# Patient Record
Sex: Male | Born: 1975 | Race: Black or African American | Hispanic: No | Marital: Married | Smoking: Never smoker
Health system: Southern US, Community
[De-identification: ages and names within clinical notes are randomized; demographics above are authoritative.]

## PROBLEM LIST (undated history)

## (undated) DIAGNOSIS — I10 Essential (primary) hypertension: Secondary | ICD-10-CM

---

## 2014-05-09 ENCOUNTER — Ambulatory Visit (INDEPENDENT_AMBULATORY_CARE_PROVIDER_SITE_OTHER): Payer: BLUE CROSS/BLUE SHIELD

## 2014-05-09 ENCOUNTER — Ambulatory Visit (INDEPENDENT_AMBULATORY_CARE_PROVIDER_SITE_OTHER): Payer: BLUE CROSS/BLUE SHIELD | Admitting: Family Medicine

## 2014-05-09 ENCOUNTER — Encounter (HOSPITAL_COMMUNITY): Payer: Self-pay | Admitting: Emergency Medicine

## 2014-05-09 ENCOUNTER — Inpatient Hospital Stay (HOSPITAL_COMMUNITY): Payer: Non-veteran care

## 2014-05-09 ENCOUNTER — Encounter (HOSPITAL_COMMUNITY)
Admission: EM | Disposition: A | Discharge status: Home / Self Care | Payer: Non-veteran care | Source: Home / Self Care | Attending: Neurosurgery

## 2014-05-09 ENCOUNTER — Inpatient Hospital Stay (HOSPITAL_COMMUNITY): Payer: Non-veteran care | Admitting: Certified Registered Nurse Anesthetist

## 2014-05-09 ENCOUNTER — Inpatient Hospital Stay (HOSPITAL_COMMUNITY)
Admission: EM | Admit: 2014-05-09 | Discharge: 2014-05-11 | DRG: 472 | Disposition: A | Discharge status: Home / Self Care | Payer: Non-veteran care | Attending: Neurosurgery | Admitting: Neurosurgery

## 2014-05-09 VITALS — BP 120/86 | HR 77 | Temp 98.4°F | Resp 16 | Ht 68.75 in | Wt 177.0 lb

## 2014-05-09 DIAGNOSIS — I10 Essential (primary) hypertension: Secondary | ICD-10-CM | POA: Diagnosis present

## 2014-05-09 DIAGNOSIS — M8458XA Pathological fracture in neoplastic disease, other specified site, initial encounter for fracture: Secondary | ICD-10-CM | POA: Diagnosis present

## 2014-05-09 DIAGNOSIS — Z791 Long term (current) use of non-steroidal anti-inflammatories (NSAID): Secondary | ICD-10-CM

## 2014-05-09 DIAGNOSIS — D492 Neoplasm of unspecified behavior of bone, soft tissue, and skin: Secondary | ICD-10-CM | POA: Diagnosis present

## 2014-05-09 DIAGNOSIS — M4852XG Collapsed vertebra, not elsewhere classified, cervical region, subsequent encounter for fracture with delayed healing: Secondary | ICD-10-CM | POA: Diagnosis not present

## 2014-05-09 DIAGNOSIS — M791 Myalgia: Secondary | ICD-10-CM | POA: Diagnosis present

## 2014-05-09 DIAGNOSIS — Z808 Family history of malignant neoplasm of other organs or systems: Secondary | ICD-10-CM | POA: Diagnosis not present

## 2014-05-09 DIAGNOSIS — S12490B Other displaced fracture of fifth cervical vertebra, initial encounter for open fracture: Secondary | ICD-10-CM | POA: Insufficient documentation

## 2014-05-09 DIAGNOSIS — M4120 Other idiopathic scoliosis, site unspecified: Secondary | ICD-10-CM | POA: Diagnosis not present

## 2014-05-09 DIAGNOSIS — R740 Nonspecific elevation of levels of transaminase and lactic acid dehydrogenase [LDH]: Secondary | ICD-10-CM | POA: Diagnosis present

## 2014-05-09 DIAGNOSIS — M4852XA Collapsed vertebra, not elsewhere classified, cervical region, initial encounter for fracture: Secondary | ICD-10-CM

## 2014-05-09 DIAGNOSIS — D751 Secondary polycythemia: Secondary | ICD-10-CM | POA: Diagnosis not present

## 2014-05-09 DIAGNOSIS — Z8249 Family history of ischemic heart disease and other diseases of the circulatory system: Secondary | ICD-10-CM | POA: Diagnosis not present

## 2014-05-09 DIAGNOSIS — R011 Cardiac murmur, unspecified: Secondary | ICD-10-CM | POA: Diagnosis present

## 2014-05-09 DIAGNOSIS — M8448XA Pathological fracture, other site, initial encounter for fracture: Secondary | ICD-10-CM | POA: Diagnosis present

## 2014-05-09 DIAGNOSIS — M8448XG Pathological fracture, other site, subsequent encounter for fracture with delayed healing: Secondary | ICD-10-CM

## 2014-05-09 DIAGNOSIS — G9529 Other cord compression: Secondary | ICD-10-CM | POA: Diagnosis present

## 2014-05-09 DIAGNOSIS — R918 Other nonspecific abnormal finding of lung field: Secondary | ICD-10-CM | POA: Diagnosis present

## 2014-05-09 DIAGNOSIS — S129XXD Fracture of neck, unspecified, subsequent encounter: Secondary | ICD-10-CM | POA: Diagnosis not present

## 2014-05-09 DIAGNOSIS — R200 Anesthesia of skin: Secondary | ICD-10-CM

## 2014-05-09 DIAGNOSIS — M4850XA Collapsed vertebra, not elsewhere classified, site unspecified, initial encounter for fracture: Secondary | ICD-10-CM

## 2014-05-09 DIAGNOSIS — R208 Other disturbances of skin sensation: Secondary | ICD-10-CM

## 2014-05-09 DIAGNOSIS — M542 Cervicalgia: Secondary | ICD-10-CM

## 2014-05-09 DIAGNOSIS — E559 Vitamin D deficiency, unspecified: Secondary | ICD-10-CM | POA: Diagnosis present

## 2014-05-09 DIAGNOSIS — S13161A Dislocation of C5/C6 cervical vertebrae, initial encounter: Secondary | ICD-10-CM | POA: Diagnosis not present

## 2014-05-09 DIAGNOSIS — R7401 Elevation of levels of liver transaminase levels: Secondary | ICD-10-CM | POA: Insufficient documentation

## 2014-05-09 DIAGNOSIS — Z419 Encounter for procedure for purposes other than remedying health state, unspecified: Secondary | ICD-10-CM

## 2014-05-09 DIAGNOSIS — M40209 Unspecified kyphosis, site unspecified: Secondary | ICD-10-CM | POA: Diagnosis present

## 2014-05-09 DIAGNOSIS — S129XXA Fracture of neck, unspecified, initial encounter: Secondary | ICD-10-CM | POA: Diagnosis present

## 2014-05-09 HISTORY — PX: ANTERIOR CERVICAL CORPECTOMY: SHX1159

## 2014-05-09 HISTORY — DX: Essential (primary) hypertension: I10

## 2014-05-09 LAB — COMPREHENSIVE METABOLIC PANEL
ALT: 74 U/L — ABNORMAL HIGH (ref 0–53)
AST: 41 U/L — AB (ref 0–37)
Albumin: 4.4 g/dL (ref 3.5–5.2)
Alkaline Phosphatase: 126 U/L — ABNORMAL HIGH (ref 39–117)
Anion gap: 6 (ref 5–15)
BUN: 13 mg/dL (ref 6–23)
CALCIUM: 9.8 mg/dL (ref 8.4–10.5)
CO2: 33 mmol/L — AB (ref 19–32)
CREATININE: 1.35 mg/dL (ref 0.50–1.35)
Chloride: 103 mmol/L (ref 96–112)
GFR, EST AFRICAN AMERICAN: 77 mL/min — AB (ref >90)
GFR, EST NON AFRICAN AMERICAN: 66 mL/min — AB (ref >90)
Glucose, Bld: 93 mg/dL (ref 70–99)
Potassium: 4.1 mmol/L (ref 3.5–5.1)
Sodium: 142 mmol/L (ref 135–145)
TOTAL PROTEIN: 8 g/dL (ref 6.0–8.3)
Total Bilirubin: 0.7 mg/dL (ref 0.3–1.2)

## 2014-05-09 LAB — CBC WITH DIFFERENTIAL/PLATELET
BASOS PCT: 0 % (ref 0–1)
Basophils Absolute: 0 10*3/uL (ref 0.0–0.1)
Eosinophils Absolute: 0.1 10*3/uL (ref 0.0–0.7)
Eosinophils Relative: 1 % (ref 0–5)
HCT: 49.1 % (ref 39.0–52.0)
Hemoglobin: 17.3 g/dL — ABNORMAL HIGH (ref 13.0–17.0)
LYMPHS ABS: 2.2 10*3/uL (ref 0.7–4.0)
Lymphocytes Relative: 26 % (ref 12–46)
MCH: 30.5 pg (ref 26.0–34.0)
MCHC: 35.2 g/dL (ref 30.0–36.0)
MCV: 86.6 fL (ref 78.0–100.0)
Monocytes Absolute: 0.5 10*3/uL (ref 0.1–1.0)
Monocytes Relative: 6 % (ref 3–12)
NEUTROS ABS: 5.7 10*3/uL (ref 1.7–7.7)
NEUTROS PCT: 67 % (ref 43–77)
Platelets: 341 10*3/uL (ref 150–400)
RBC: 5.67 MIL/uL (ref 4.22–5.81)
RDW: 12.9 % (ref 11.5–15.5)
WBC: 8.4 10*3/uL (ref 4.0–10.5)

## 2014-05-09 LAB — POCT CBC
Granulocyte percent: 69.4 %G (ref 37–80)
HCT, POC: 52.6 % (ref 43.5–53.7)
Hemoglobin: 17.6 g/dL (ref 14.1–18.1)
Lymph, poc: 2.4 (ref 0.6–3.4)
MCH, POC: 29.6 pg (ref 27–31.2)
MCHC: 33.5 g/dL (ref 31.8–35.4)
MCV: 88.4 fL (ref 80–97)
MID (cbc): 0.5 (ref 0–0.9)
MPV: 7.1 fL (ref 0–99.8)
PLATELET COUNT, POC: 352 10*3/uL (ref 142–424)
POC Granulocyte: 6.5 (ref 2–6.9)
POC LYMPH %: 25.3 % (ref 10–50)
POC MID %: 5.3 %M (ref 0–12)
RBC: 5.95 M/uL (ref 4.69–6.13)
RDW, POC: 13.6 %
WBC: 9.3 10*3/uL (ref 4.6–10.2)

## 2014-05-09 LAB — LACTATE DEHYDROGENASE: LDH: 141 U/L (ref 94–250)

## 2014-05-09 LAB — GRAM STAIN

## 2014-05-09 LAB — POCT SEDIMENTATION RATE: POCT SED RATE: 20 mm/h (ref 0–22)

## 2014-05-09 SURGERY — ANTERIOR CERVICAL CORPECTOMY
Anesthesia: General | Site: Neck

## 2014-05-09 MED ORDER — SODIUM CHLORIDE 0.9 % IJ SOLN: 3.0000 mL | INTRAMUSCULAR | Status: DC | PRN

## 2014-05-09 MED ORDER — ONDANSETRON HCL 4 MG/2ML IJ SOLN
4.0000 mg | INTRAMUSCULAR | Status: DC | PRN
  Administered 2014-05-10: 4 mg via INTRAVENOUS
  Filled 2014-05-09: qty 2

## 2014-05-09 MED ORDER — CEFAZOLIN SODIUM 1-5 GM-% IV SOLN
1.0000 g | Freq: Three times a day (TID) | INTRAVENOUS | Status: DC
  Administered 2014-05-10 – 2014-05-11 (×5): 1 g via INTRAVENOUS
  Filled 2014-05-09 (×7): qty 50

## 2014-05-09 MED ORDER — ONDANSETRON HCL 4 MG/2ML IJ SOLN
INTRAMUSCULAR | Status: AC
  Filled 2014-05-09: qty 6

## 2014-05-09 MED ORDER — GLYCOPYRROLATE 0.2 MG/ML IJ SOLN
INTRAMUSCULAR | Status: AC
  Filled 2014-05-09: qty 2

## 2014-05-09 MED ORDER — SODIUM CHLORIDE 0.9 % IR SOLN
Status: DC | PRN
  Administered 2014-05-09: 500 mL

## 2014-05-09 MED ORDER — SODIUM CHLORIDE 0.9 % IJ SOLN
3.0000 mL | Freq: Two times a day (BID) | INTRAMUSCULAR | Status: DC
  Administered 2014-05-10: 3 mL via INTRAVENOUS

## 2014-05-09 MED ORDER — ROCURONIUM BROMIDE 50 MG/5ML IV SOLN
INTRAVENOUS | Status: AC
  Filled 2014-05-09: qty 1

## 2014-05-09 MED ORDER — THROMBIN 5000 UNITS EX SOLR
CUTANEOUS | Status: DC | PRN
  Administered 2014-05-09 (×2): 5000 [IU] via TOPICAL

## 2014-05-09 MED ORDER — LIDOCAINE HCL (CARDIAC) 20 MG/ML IV SOLN
INTRAVENOUS | Status: AC
  Filled 2014-05-09: qty 15

## 2014-05-09 MED ORDER — MIDAZOLAM HCL 5 MG/5ML IJ SOLN
INTRAMUSCULAR | Status: DC | PRN
  Administered 2014-05-09: 2 mg via INTRAVENOUS

## 2014-05-09 MED ORDER — DEXAMETHASONE SODIUM PHOSPHATE 10 MG/ML IJ SOLN
INTRAMUSCULAR | Status: DC | PRN
  Administered 2014-05-09: 10 mg via INTRAVENOUS

## 2014-05-09 MED ORDER — ESMOLOL HCL 10 MG/ML IV SOLN
INTRAVENOUS | Status: DC | PRN
  Administered 2014-05-09 (×2): 20 mg via INTRAVENOUS

## 2014-05-09 MED ORDER — ACETAMINOPHEN 325 MG PO TABS: 650.0000 mg | ORAL_TABLET | Freq: Four times a day (QID) | ORAL | Status: DC | PRN

## 2014-05-09 MED ORDER — NEOSTIGMINE METHYLSULFATE 10 MG/10ML IV SOLN
INTRAVENOUS | Status: AC
  Filled 2014-05-09: qty 1

## 2014-05-09 MED ORDER — LIDOCAINE HCL (CARDIAC) 20 MG/ML IV SOLN
INTRAVENOUS | Status: DC | PRN
  Administered 2014-05-09: 80 mg via INTRAVENOUS

## 2014-05-09 MED ORDER — DEXAMETHASONE SODIUM PHOSPHATE 10 MG/ML IJ SOLN
INTRAMUSCULAR | Status: AC
  Filled 2014-05-09: qty 1

## 2014-05-09 MED ORDER — LACTATED RINGERS IV SOLN
INTRAVENOUS | Status: DC | PRN
  Administered 2014-05-09 (×3): via INTRAVENOUS

## 2014-05-09 MED ORDER — PROMETHAZINE HCL 25 MG PO TABS
12.5000 mg | ORAL_TABLET | Freq: Four times a day (QID) | ORAL | Status: DC | PRN
  Filled 2014-05-09: qty 1

## 2014-05-09 MED ORDER — ONDANSETRON HCL 4 MG/2ML IJ SOLN
INTRAMUSCULAR | Status: DC | PRN
  Administered 2014-05-09: 4 mg via INTRAVENOUS

## 2014-05-09 MED ORDER — CEFAZOLIN SODIUM-DEXTROSE 2-3 GM-% IV SOLR
INTRAVENOUS | Status: DC | PRN
  Administered 2014-05-09: 2 g via INTRAVENOUS

## 2014-05-09 MED ORDER — NEOSTIGMINE METHYLSULFATE 10 MG/10ML IV SOLN
INTRAVENOUS | Status: DC | PRN
  Administered 2014-05-09: 5 mg via INTRAVENOUS

## 2014-05-09 MED ORDER — FENTANYL CITRATE 0.05 MG/ML IJ SOLN
INTRAMUSCULAR | Status: DC | PRN
  Administered 2014-05-09 (×2): 50 ug via INTRAVENOUS
  Administered 2014-05-09: 100 ug via INTRAVENOUS
  Administered 2014-05-09 (×5): 50 ug via INTRAVENOUS
  Administered 2014-05-09: 150 ug via INTRAVENOUS
  Administered 2014-05-09 (×3): 50 ug via INTRAVENOUS

## 2014-05-09 MED ORDER — ESMOLOL HCL 10 MG/ML IV SOLN
INTRAVENOUS | Status: AC
  Filled 2014-05-09: qty 20

## 2014-05-09 MED ORDER — SENNOSIDES-DOCUSATE SODIUM 8.6-50 MG PO TABS
1.0000 | ORAL_TABLET | Freq: Every evening | ORAL | Status: DC | PRN
  Filled 2014-05-09: qty 1

## 2014-05-09 MED ORDER — DEXAMETHASONE SODIUM PHOSPHATE 10 MG/ML IJ SOLN
10.0000 mg | Freq: Once | INTRAMUSCULAR | Status: AC
  Administered 2014-05-09: 10 mg via INTRAVENOUS
  Filled 2014-05-09: qty 1

## 2014-05-09 MED ORDER — CYCLOBENZAPRINE HCL 10 MG PO TABS: 10.0000 mg | ORAL_TABLET | Freq: Three times a day (TID) | ORAL | Status: DC | PRN

## 2014-05-09 MED ORDER — MIDAZOLAM HCL 2 MG/2ML IJ SOLN
INTRAMUSCULAR | Status: AC
  Filled 2014-05-09: qty 2

## 2014-05-09 MED ORDER — HYDROMORPHONE HCL 1 MG/ML IJ SOLN: 0.2500 mg | INTRAMUSCULAR | Status: DC | PRN

## 2014-05-09 MED ORDER — HEMOSTATIC AGENTS (NO CHARGE) OPTIME
TOPICAL | Status: DC | PRN
  Administered 2014-05-09: 1 via TOPICAL

## 2014-05-09 MED ORDER — DEXAMETHASONE SODIUM PHOSPHATE 4 MG/ML IJ SOLN: 6.0000 mg | Freq: Four times a day (QID) | INTRAMUSCULAR | Status: DC

## 2014-05-09 MED ORDER — HYDROCODONE-ACETAMINOPHEN 5-325 MG PO TABS: 1.0000 | ORAL_TABLET | ORAL | Status: DC | PRN

## 2014-05-09 MED ORDER — MENTHOL 3 MG MT LOZG
1.0000 | LOZENGE | OROMUCOSAL | Status: DC | PRN
  Administered 2014-05-10 – 2014-05-11 (×2): 3 mg via ORAL
  Filled 2014-05-09 (×2): qty 9

## 2014-05-09 MED ORDER — ACETAMINOPHEN 325 MG PO TABS: 650.0000 mg | ORAL_TABLET | ORAL | Status: DC | PRN

## 2014-05-09 MED ORDER — OXYCODONE-ACETAMINOPHEN 5-325 MG PO TABS
1.0000 | ORAL_TABLET | ORAL | Status: DC | PRN
  Administered 2014-05-10 – 2014-05-11 (×2): 2 via ORAL
  Administered 2014-05-11: 1 via ORAL
  Filled 2014-05-09 (×3): qty 2

## 2014-05-09 MED ORDER — SUCCINYLCHOLINE CHLORIDE 20 MG/ML IJ SOLN
INTRAMUSCULAR | Status: AC
  Filled 2014-05-09: qty 1

## 2014-05-09 MED ORDER — FENTANYL CITRATE 0.05 MG/ML IJ SOLN
INTRAMUSCULAR | Status: AC
  Filled 2014-05-09: qty 5

## 2014-05-09 MED ORDER — DEXAMETHASONE SODIUM PHOSPHATE 4 MG/ML IJ SOLN
4.0000 mg | Freq: Four times a day (QID) | INTRAMUSCULAR | Status: DC
  Administered 2014-05-10 (×2): 4 mg via INTRAVENOUS
  Filled 2014-05-09 (×6): qty 1

## 2014-05-09 MED ORDER — ROCURONIUM BROMIDE 100 MG/10ML IV SOLN
INTRAVENOUS | Status: DC | PRN
  Administered 2014-05-09: 50 mg via INTRAVENOUS

## 2014-05-09 MED ORDER — PHENOL 1.4 % MT LIQD: 1.0000 | OROMUCOSAL | Status: DC | PRN

## 2014-05-09 MED ORDER — GLYCOPYRROLATE 0.2 MG/ML IJ SOLN
INTRAMUSCULAR | Status: DC | PRN
  Administered 2014-05-09: 0.6 mg via INTRAVENOUS

## 2014-05-09 MED ORDER — ALBUTEROL SULFATE HFA 108 (90 BASE) MCG/ACT IN AERS
INHALATION_SPRAY | RESPIRATORY_TRACT | Status: AC
  Filled 2014-05-09: qty 13.4

## 2014-05-09 MED ORDER — GADOBENATE DIMEGLUMINE 529 MG/ML IV SOLN
17.0000 mL | Freq: Once | INTRAVENOUS | Status: AC | PRN
  Administered 2014-05-09: 17 mL via INTRAVENOUS

## 2014-05-09 MED ORDER — PHENYLEPHRINE HCL 10 MG/ML IJ SOLN
INTRAMUSCULAR | Status: DC | PRN
  Administered 2014-05-09: 80 ug via INTRAVENOUS

## 2014-05-09 MED ORDER — PHENYLEPHRINE 40 MCG/ML (10ML) SYRINGE FOR IV PUSH (FOR BLOOD PRESSURE SUPPORT)
PREFILLED_SYRINGE | INTRAVENOUS | Status: AC
  Filled 2014-05-09: qty 40

## 2014-05-09 MED ORDER — ROCURONIUM BROMIDE 50 MG/5ML IV SOLN
INTRAVENOUS | Status: AC
  Filled 2014-05-09: qty 2

## 2014-05-09 MED ORDER — STERILE WATER FOR INJECTION IJ SOLN
INTRAMUSCULAR | Status: AC
  Filled 2014-05-09: qty 30

## 2014-05-09 MED ORDER — VECURONIUM BROMIDE 10 MG IV SOLR
INTRAVENOUS | Status: DC | PRN
Start: 2014-05-09 — End: 2014-05-09
  Administered 2014-05-09 (×7): 2 mg via INTRAVENOUS

## 2014-05-09 MED ORDER — PROPOFOL 10 MG/ML IV BOLUS
INTRAVENOUS | Status: DC | PRN
  Administered 2014-05-09: 200 mg via INTRAVENOUS

## 2014-05-09 MED ORDER — THROMBIN 5000 UNITS EX SOLR
OROMUCOSAL | Status: DC | PRN
  Administered 2014-05-09 (×3): 5 mL via TOPICAL

## 2014-05-09 MED ORDER — HYDROCORTISONE NA SUCCINATE PF 100 MG IJ SOLR
INTRAMUSCULAR | Status: AC
  Filled 2014-05-09: qty 2

## 2014-05-09 MED ORDER — 0.9 % SODIUM CHLORIDE (POUR BTL) OPTIME
TOPICAL | Status: DC | PRN
  Administered 2014-05-09: 1000 mL

## 2014-05-09 MED ORDER — CEFAZOLIN SODIUM-DEXTROSE 2-3 GM-% IV SOLR
INTRAVENOUS | Status: AC
  Filled 2014-05-09: qty 50

## 2014-05-09 MED ORDER — DEXAMETHASONE 4 MG PO TABS
4.0000 mg | ORAL_TABLET | Freq: Four times a day (QID) | ORAL | Status: DC
  Administered 2014-05-10 – 2014-05-11 (×5): 4 mg via ORAL
  Filled 2014-05-09 (×8): qty 1

## 2014-05-09 MED ORDER — VECURONIUM BROMIDE 10 MG IV SOLR
INTRAVENOUS | Status: AC
  Filled 2014-05-09: qty 20

## 2014-05-09 MED ORDER — HYDROMORPHONE HCL 1 MG/ML IJ SOLN
0.5000 mg | INTRAMUSCULAR | Status: DC | PRN
  Administered 2014-05-10 (×2): 1 mg via INTRAVENOUS
  Filled 2014-05-09 (×2): qty 1

## 2014-05-09 MED ORDER — ACETAMINOPHEN 650 MG RE SUPP: 650.0000 mg | Freq: Four times a day (QID) | RECTAL | Status: DC | PRN

## 2014-05-09 MED ORDER — ONDANSETRON HCL 4 MG/2ML IJ SOLN
4.0000 mg | Freq: Once | INTRAMUSCULAR | Status: DC | PRN
Start: 2014-05-09 — End: 2014-05-09

## 2014-05-09 MED ORDER — OXYCODONE HCL 5 MG/5ML PO SOLN: 5.0000 mg | Freq: Once | ORAL | Status: DC | PRN

## 2014-05-09 MED ORDER — SODIUM CHLORIDE 0.9 % IV SOLN: 250.0000 mL | INTRAVENOUS | Status: DC

## 2014-05-09 MED ORDER — PROPOFOL 10 MG/ML IV BOLUS
INTRAVENOUS | Status: AC
  Filled 2014-05-09: qty 20

## 2014-05-09 MED ORDER — ACETAMINOPHEN 650 MG RE SUPP: 650.0000 mg | RECTAL | Status: DC | PRN

## 2014-05-09 MED ORDER — OXYCODONE HCL 5 MG PO TABS: 5.0000 mg | ORAL_TABLET | Freq: Once | ORAL | Status: DC | PRN

## 2014-05-09 MED ORDER — DEXAMETHASONE SODIUM PHOSPHATE 4 MG/ML IJ SOLN
6.0000 mg | Freq: Four times a day (QID) | INTRAMUSCULAR | Status: DC
  Filled 2014-05-09 (×2): qty 1.5

## 2014-05-09 SURGICAL SUPPLY — 64 items
BAG DECANTER FOR FLEXI CONT (MISCELLANEOUS) ×3 IMPLANT
BENZOIN TINCTURE PRP APPL 2/3 (GAUZE/BANDAGES/DRESSINGS) ×3 IMPLANT
BONE ALLOSTEM MORSELIZED 5CC (Bone Implant) ×3 IMPLANT
BRUSH SCRUB EZ PLAIN DRY (MISCELLANEOUS) ×3 IMPLANT
BUR MATCHSTICK NEURO 3.0 LAGG (BURR) ×3 IMPLANT
CANISTER SUCT 3000ML PPV (MISCELLANEOUS) ×3 IMPLANT
CLOSURE WOUND 1/2 X4 (GAUZE/BANDAGES/DRESSINGS) ×1
CONT SPEC 4OZ CLIKSEAL STRL BL (MISCELLANEOUS) ×12 IMPLANT
DECANTER SPIKE VIAL GLASS SM (MISCELLANEOUS) ×3 IMPLANT
DRAPE C-ARM 42X72 X-RAY (DRAPES) ×6 IMPLANT
DRAPE LAPAROTOMY 100X72 PEDS (DRAPES) ×3 IMPLANT
DRAPE MICROSCOPE LEICA (MISCELLANEOUS) ×3 IMPLANT
DRAPE POUCH INSTRU U-SHP 10X18 (DRAPES) ×3 IMPLANT
DRILL BIT 18MM (BIT) ×3 IMPLANT
DRSG OPSITE POSTOP 4X6 (GAUZE/BANDAGES/DRESSINGS) ×3 IMPLANT
DURAPREP 6ML APPLICATOR 50/CS (WOUND CARE) ×3 IMPLANT
ELECT COATED BLADE 2.86 ST (ELECTRODE) ×3 IMPLANT
ELECT REM PT RETURN 9FT ADLT (ELECTROSURGICAL) ×3
ELECTRODE REM PT RTRN 9FT ADLT (ELECTROSURGICAL) ×1 IMPLANT
GAUZE SPONGE 4X4 12PLY STRL (GAUZE/BANDAGES/DRESSINGS) ×3 IMPLANT
GAUZE SPONGE 4X4 16PLY XRAY LF (GAUZE/BANDAGES/DRESSINGS) IMPLANT
GLOVE BIO SURGEON STRL SZ 6.5 (GLOVE) ×6 IMPLANT
GLOVE BIO SURGEON STRL SZ7 (GLOVE) ×3 IMPLANT
GLOVE BIO SURGEON STRL SZ8 (GLOVE) ×6 IMPLANT
GLOVE BIO SURGEON STRL SZ8.5 (GLOVE) ×3 IMPLANT
GLOVE BIO SURGEONS STRL SZ 6.5 (GLOVE) ×3
GLOVE EXAM NITRILE LRG STRL (GLOVE) IMPLANT
GLOVE EXAM NITRILE MD LF STRL (GLOVE) IMPLANT
GLOVE EXAM NITRILE XL STR (GLOVE) IMPLANT
GLOVE EXAM NITRILE XS STR PU (GLOVE) IMPLANT
GLOVE INDICATOR 6.5 STRL GRN (GLOVE) ×6 IMPLANT
GLOVE INDICATOR 7.5 STRL GRN (GLOVE) ×3 IMPLANT
GLOVE INDICATOR 8.5 STRL (GLOVE) ×3 IMPLANT
GOWN STRL REUS W/ TWL LRG LVL3 (GOWN DISPOSABLE) ×2 IMPLANT
GOWN STRL REUS W/ TWL XL LVL3 (GOWN DISPOSABLE) ×1 IMPLANT
GOWN STRL REUS W/TWL 2XL LVL3 (GOWN DISPOSABLE) ×3 IMPLANT
GOWN STRL REUS W/TWL LRG LVL3 (GOWN DISPOSABLE) ×4
GOWN STRL REUS W/TWL XL LVL3 (GOWN DISPOSABLE) ×2
HALTER HD/CHIN CERV TRACTION D (MISCELLANEOUS) ×3 IMPLANT
HEMOSTAT POWDER KIT SURGIFOAM (HEMOSTASIS) IMPLANT
KIT BASIN OR (CUSTOM PROCEDURE TRAY) ×3 IMPLANT
KIT ROOM TURNOVER OR (KITS) ×3 IMPLANT
LIQUID BAND (GAUZE/BANDAGES/DRESSINGS) ×3 IMPLANT
NEEDLE SPNL 20GX3.5 QUINCKE YW (NEEDLE) ×3 IMPLANT
NS IRRIG 1000ML POUR BTL (IV SOLUTION) ×3 IMPLANT
PACK LAMINECTOMY NEURO (CUSTOM PROCEDURE TRAY) ×3 IMPLANT
PIN DISTRACTION 14MM (PIN) ×6 IMPLANT
PIN DISTRATION 14MM (PIN) ×6 IMPLANT
PLATE ANT CERV XTEND 2 LV 42 (Plate) ×3 IMPLANT
RUBBERBAND STERILE (MISCELLANEOUS) ×6 IMPLANT
SCREW FXD 4.2 XTD SELF TAP 18 (Screw) ×12 IMPLANT
SPACER XPAND 32-38MM (Spacer) ×3 IMPLANT
SPONGE INTESTINAL PEANUT (DISPOSABLE) ×3 IMPLANT
SPONGE SURGIFOAM ABS GEL 100 (HEMOSTASIS) ×3 IMPLANT
STRIP CLOSURE SKIN 1/2X4 (GAUZE/BANDAGES/DRESSINGS) ×2 IMPLANT
SUT VIC AB 3-0 SH 8-18 (SUTURE) ×3 IMPLANT
SUT VICRYL 4-0 PS2 18IN ABS (SUTURE) ×3 IMPLANT
SYR 20ML ECCENTRIC (SYRINGE) ×3 IMPLANT
TAPE CLOTH 4X10 WHT NS (GAUZE/BANDAGES/DRESSINGS) IMPLANT
TAPE STRIPS DRAPE STRL (GAUZE/BANDAGES/DRESSINGS) ×3 IMPLANT
TOWEL OR 17X24 6PK STRL BLUE (TOWEL DISPOSABLE) ×3 IMPLANT
TOWEL OR 17X26 10 PK STRL BLUE (TOWEL DISPOSABLE) ×3 IMPLANT
TRAP SPECIMEN MUCOUS 40CC (MISCELLANEOUS) ×3 IMPLANT
WATER STERILE IRR 1000ML POUR (IV SOLUTION) ×3 IMPLANT

## 2014-05-09 NOTE — ED Notes (Signed)
MD notified this nurse spoke with Neurosurgery, Neuro stated STAT MRI with and Without contrast. ED MD stated would examine patient.

## 2014-05-09 NOTE — ED Notes (Signed)
Patient placed in [place] collar. Per MD request.

## 2014-05-09 NOTE — Anesthesia Procedure Notes (Signed)
Procedure Name: Intubation Date/Time: 05/09/2014 6:00 PM Performed by: Clearnce Sorrel Pre-anesthesia Checklist: Patient identified, Emergency Drugs available, Suction available and Patient being monitored Patient Re-evaluated:Patient Re-evaluated prior to inductionOxygen Delivery Method: Circle system utilized Preoxygenation: Pre-oxygenation with 100% oxygen Intubation Type: IV induction Ventilation: Mask ventilation without difficulty Laryngoscope Size: Glidescope (elective with manual in line stabllization ) Grade View: Grade I Tube type: Oral Tube size: 7.0 mm Number of attempts: 1 Airway Equipment and Method: Stylet and Video-laryngoscopy Placement Confirmation: ETT inserted through vocal cords under direct vision,  positive ETCO2 and breath sounds checked- equal and bilateral Secured at: 22 cm Tube secured with: Tape Dental Injury: Teeth and Oropharynx as per pre-operative assessment

## 2014-05-09 NOTE — Progress Notes (Addendum)
Subjective: 39 year old man who is here complaining of pain in his neck. He has a history of sleeping on the couch from about 8 months ago and developing neck pain and spasm. He went to the [facility] and they treated him with ibuprofen and baclofen. He did not seem to improve a lot with that. The ibuprofen caused constipation so they switching to naproxen which she did better with. However he continues to have a lot of pain in his neck. It hurts in the neck itself, and in the paraspinous muscles size of the neck. He now hurts in the triceps areas of his upper arms. He has full range of motion of his arm pretty aches and hurts there. Now it is moving down to where he has a cold sensation in a tingling at times in his fingers. He does have a history of scoliosis but does not feel like it is down that level that is causing the problem.  [facility] showed some narrowing of one of the lower lumbar disc spaces.  He is a English as a second language teacher, works in [employer]. He has a job which will take him back over to [place] about a month now. His wife was pregnant so he stayed in the [place] since the first of the year and was not working for several months. He has an appointment with the rheumatologist at the [facility] in a few weeks, but he wanted to come in here to get another opinion.  Knows of no motor vehicle accidents or other injuries. Played a lot of sports when he was young.  Objective: He holds his neck very rigidly, cocked slightly to the left which is the angle that is scoliosis those and 2. He is tender in the paraspinous muscles, very tender at about the L4-5 6 area of the cervical spine. Has very limited flexion extension rotation. His arms have good strength. Gross sensation is normal in his hands. Full range of motion of his shoulder. Tenderness of the trapezius areas.  Assessment: Cervical pain, suspicious for cervical disc disease c5 Scoliosis  Plan: C-spine, sedimentation rate, CBC  [facility]  reading (PRIMARY) by  Dr. Linna Darner Completely deteriorated C5 with spine and on for Dr. Linna Darner in 6-8 mm or more out of line  Will get a stat reading on the x-ray and will speak to the neurosurgeon on call. Patient will need to be transported to the hospital. This appears to be a very unstable C-spine and he is having nerve compression symptoms into his upper body  Results for orders placed or performed in visit on 05/09/14  POCT CBC  Result Value Ref Range   WBC 9.3 4.6 - 10.2 K/uL   Lymph, poc 2.4 0.6 - 3.4   POC LYMPH PERCENT 25.3 10 - 50 %L   MID (cbc) 0.5 0 - 0.9   POC MID % 5.3 0 - 12 %M   POC Granulocyte 6.5 2 - 6.9   Granulocyte percent 69.4 37 - 80 %G   RBC 5.95 4.69 - 6.13 M/uL   Hemoglobin 17.6 14.1 - 18.1 g/dL   HCT, POC 52.6 43.5 - 53.7 %   MCV 88.4 80 - 97 fL   MCH, POC 29.6 27 - 31.2 pg   MCHC 33.5 31.8 - 35.4 g/dL   RDW, POC 13.6 %   Platelet Count, POC 352 142 - 424 K/uL   MPV 7.1 0 - 99.8 fL   Dr. Everlene Farrier discussed with Dr. Saintclair Halsted who will see him at  the [facility].  Patient will be transported by EMS. He has been placed in a cervical collar, and will be placed on a backboard.   Radiology reading: Complete compression of C6 vertebral body with posterior tilting of C5 most likely impinging upon the spinal canal and cord. Dr. Linna Darner was contacted and the patient will be taken to the emergent department and MRI of the cervical spine performed to assess further.  I am suspicious that this could be TB of the spine.

## 2014-05-09 NOTE — Op Note (Signed)
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  EBL: Minimal  History of present illness: Patient is 39 year old gentleman with 3 months of neck pain and shoulder pain presented to urgent care today with a lateral C-spine showing pathologic  disruption of the C6 vertebral body and displacement the C5 vertebral body posteriorly towards the spinal cord. Patient was brought to the emergency room and underwent workup with MRI scan which showed probable neoplastic process with pathologic involvement of the C5 and C6 vertebral bodies with severe cord compression. Patient's clinical exam was consistent with myelopathy and I recommended anterior cervical corpectomies and fusion. I extensively went over the risks and benefits of the operation as well as the possibility of having to come back in delayed fashion for posterior augmentation. I reviewed all this to the patient he understand and agreed to proceed forward.  Operative procedure: Patient brought into the or was induced under general anesthesia positioned supine the neck in neutral position in 5 pounds of halter traction preoperative x-ray confirmed identification of the appropriate level and partial reduction of the deformity. The right side his neck was prepped and draped in routine sterile fashion and a curvilinear incision was made just off midline to aid with cervical masses partially of  the platysmas dissected and divided longitudinally the avascular plane to sternomastoid and strap as was Dr. improved fashion. First pass away with [place]. There was a large phlegmon and the prevertebral space in front of the C5 and C6 vertebral bodies this was all removed with pituitary rongeurs and sent for both culture both bacterial anaerobic aerobic fungal and AFB as well as pathology for neoplastic evaluation. Then identified the disc spaces at C4-5 and C6-7 and incised the disc spaces and work between removing the C5 and C6 vertebral bodies. The C6 vertebral body was virtually completely distract disrupted except for just a little bit of vertebral plan at the inferior endplate. There was dense necrotic and soft tissue throughout both vertebral bodies Ascent a bunch of specimen for permanent section both soft tissue specimen as well as bone specimen get the epidural space is a dense amount of epidural compression and cord compression on a soft tumor involvement. All this was teased off of the dura marking laterally from the disc space I identify the level of the pedicles at C5 and finished my corpectomy extending from pedicle to pedicle down the lateral gutters. At the end of the corpectomy there is no further stenosis is the thecal sac was widely decompressed I sized up and implant and selected the expandable cage 32-38 packed it with the Allis stem mix and then expanded it opening up the space and had good apposition of the endplates. Fluoroscopy confirmed good position of the strut then I selected a 42 mm globus extend plate utilizing fluoroscopy I selected 8 mm drill bits that allowed bicortical purchase of both the C4 and C7 vertebral bodies. And all screws excellent purchase locking mechanisms were engaged space was went copiously irrigated meticulous  hemostasis was maintained at packed additional bone underneath the plate anterior to the graft alongside the graft then I placed a drain closed wound in  layers with after Vicryl and Dermabond benzo and Steri-Strips and sterile dressing and patient recovered in stable condition. At the end of case all needle counts sponge counts were correct.

## 2014-05-09 NOTE — ED Notes (Signed)
PT taken to Neuro OR.

## 2014-05-09 NOTE — ED Notes (Signed)
Patient states he slept wrong in November, started having mild neck and  Back pain, Pain has gotten progressed worse. Was seen at the [place] and stated arthities . Seen at urgent care today X-ray of the Spine shows complete compression of C6. Patient complains of tremors and tingling in the hands. Patient able to move all extremities on arrival. Neurosurgery has been paged.

## 2014-05-09 NOTE — H&P (Signed)
[facility] Admission History and Physical Service Pager: [phone]  Patient name: Douglas Curtis Medical record number: [id number] Date of birth: April 08, 1975 Age: 40 y.o. Gender: male  Primary Care Provider: Merry Proud, PA-C Consultants: Neurosurgery  Code Status: Full   Chief Complaint: Neck Pain and Upper Extremity numbness.   Assessment and Plan: Douglas Curtis is a 39 y.o. male presenting with C6 vertebral compression fracture and no significant past medical history.   # Cervical Compression Fracture: Pt. Here with C6 Vertebral Compression Fracture by Cervical XR and posterior rotation of C5 with RUE numbness / tingling and hyperreflexia with positive babinski's of LE. No known mechanical cause at this time by history. Pt. Without history of weight loss, fevers, night sweats, abdominal pain, constipation, urinary frequency or urgency, change in the character of his urine. No history of smoking or alcohol abuse. Family history of "brain cancer stage 4" in his biological sister otherwise no other known family history. Was in the [employer] with exposure to "burn pits" with unknown toxicity of chemicals / products being burned otherwise no known organic exposure. Vital signs stable. White count normal with 26% lymphs, Afebrile, Creatinine on the high end of normal, GFR 66, Slightly elevated ALP, AST, ALT. Calcium corrects to 9.5, Hemoglobin on the high end of normal at 17.3. ESR 20.  C-spine stabilized with cervical collar at this time. Differential includes metastatic neoplastic process most likely (multiple myeloma, lung, kidney, thyroid, or prostate primary), versus primary bone neoplasm vs infection vs endocrine/metabolic. Will initiate workup.  - Admit to Neurosurgery Floor under Dr. Gwendlyn Deutscher - Neurosurgery on board and has evaluated the patient > will need surgical stabilization and tissue sampling for Cultures, and Histology.  - Will need U/A - SPEP, UPEP,  Beta-2 Microglobulin, Kappa / lambda light chains.  - LDH, phos, Vit. D.  - Peripheral Smear - CT Chest without contrast now and will need Abd / Pelvis with contrast once MM ruled out > Potentially PET scan later.  - PSA, TSH - Hepatitis panel given mild LFT elevations and travel abroad.  - Will get Blood cx. Given murmur on exam and possible infectious etiology, will also need to inquire about drug use when patient is alone - Pending tissue cx and histology with operation.  - Quant gold for TB given risk factors.  - HIV  # HTN: history of hypertension, not on meds, elevated on admission - monitor and consider starting antihypertensive during admission if needed  FEN/GI: NPO for now, will give regular diet following surgery Prophylaxis: SCDs for now but will await neurosurgery recs on possible pharmacological ppx after surgery  Disposition: Pending cervical spinal stabilization and workup.   History of Present Illness: Douglas Curtis is a 39 y.o. male presenting with neck pain starting in November 2015. Patient says that he was sleeping on the couch in the middle of November, and he woke up the next morning with a twinge in his neck. Since that time he has had progressive worsening of his neck pain. He says that he went to the [place] in [place] in November at which time they performed an x-ray that according to the patient only showed osteoarthritis type changes. The patient was given NSAIDs and recommended physical therapy for pain and stability. His pain, however, continued to worsen to the point that he had difficulty with extension of his neck at all due to the pain. He also reports feeling like his neck was unstable and needing to lift  it with his hands. He says that he return to the ED, and was evaluated again with a similar diagnosis of degenerative changes and needing continued anti-inflammatory control. He says that he subsequently began to develop coldness, paresthesias, in his  left hand that he says prompted he and his wife to seek medical care again. He denies any weakness in his upper extremity, though he says that he had a hard time holding his son due to the pain in his neck when trying to do that. He denies loss of bowel or bladder control, upper extremity weakness, lower extremity weakness or numbness or sensation changes. He denies weight loss, fever, night sweats, changes in his urine character, dysuria, frequency, urgency. He denies constipation. He denies abdominal pain. He denies tenderness to the rest of the spine, though he does say that he has had some lower spinal pain in the past that resolved with strength training. He denies trauma to his spine. He says that he has played football in the past, but does not recall any significant cervical spine or head injuries. He has not had any trauma to his neck since sports in his early years or in the [employer]. He did serve in the [employer] for 9 years, he has had exposure to "burn pits" which contained products unknown to him and the fumes had unknown level of toxicity. Patient denies smoking, he drinks socially. He has not had any other systemic signs or symptoms including fever, chills. He says that the onset of the pain in his neck distinctly started in November of last year. He has no other medical history. His family history is only positive for a history of "stage IV brain cancer" in his sister but he does not know details of this diagnosis. No family history of myeloma, prostate cancer. Patient has no known allergies, and takes no medications at home.  He went to [facility] clinic today for further evaluation, at which time a cervical x-ray was ordered that revealed compression fracture of C6 with posterior rotation of C5. Patient was sent to the ED, where neurosurgery evaluated him and ordered stat MRI. Patient to be admitted for workup of this compression fracture as well as stabilization of his spine.  Review Of Systems:  Per HPI with the following additions: None Otherwise 12 point review of systems was performed and was unremarkable.  Patient Active Problem List   Diagnosis Date Noted  . Compression fracture of cervical spine 05/09/2014  . Heart murmur 05/09/2014  . Pathologic fracture of vertebrae   . Transaminitis   . Polycythemia    Past Medical History: Past Medical History  Diagnosis Date  . Hypertension    Past Surgical History: History reviewed. No pertinent past surgical history. Social History: History  Substance Use Topics  . Smoking status: Never Smoker   . Smokeless tobacco: Not on file  . Alcohol Use: No   Additional social history: None  Please also refer to relevant sections of EMR.  Family History: Family History  Problem Relation Age of Onset  . Diabetes Mother   . Hypertension Father   . COPD Sister    Allergies and Medications: No Known Allergies No current facility-administered medications on file prior to encounter.   Current Outpatient Prescriptions on File Prior to Encounter  Medication Sig Dispense Refill  . ibuprofen (ADVIL,MOTRIN) 800 MG tablet Take 800 mg by mouth every 8 (eight) hours as needed (pain).     . naproxen (NAPROSYN) 500 MG tablet Take 500 mg  by mouth 2 (two) times daily with a meal.      Objective: BP 141/90 mmHg  Pulse 92  Temp(Src) 99.2 F (37.3 C) (Oral)  Resp 18  Ht 5' 8"  (1.727 m)  Wt 175 lb (79.379 kg)  BMI 26.61 kg/m2  SpO2 97% Exam: General: NAD, AAOx3 HEENT: NCAT, MMM, Cervical Collar In place. Deferred full head / neck exam.  Cardiovascular: RRR, 3/6 systolic flow murmur RSB. No gallops, no rubs. 2+ distal pulses.  Respiratory: CTA Bilaterally. Appropriate rate, unlabored.  Abdomen: S, NT, ND, +BS Extremities: WWP, 2+ distal pulses, no rashes no lesions, moving all extremities well.  Skin: No rashes no lesions.  Neuro: AAOx3, CN II-X grossly intact XI not evaluated, XII intact. 5/5 strength in B/L upper / lower  extremities, No focal sensory deficits to light touch / pressure. 3+ reflexes of bilateral achiles, and brachioradialis. Equivocal babinski r/l, Deferred ambulation and cerebellar at this time given neck brace and movement restrictions.   Labs and Imaging: CBC BMET   Recent Labs Lab 05/09/14 1315  WBC 8.4  HGB 17.3*  HCT 49.1  PLT 341    Recent Labs Lab 05/09/14 1315  NA 142  K 4.1  CL 103  CO2 33*  BUN 13  CREATININE 1.35  GLUCOSE 93  CALCIUM 9.8     ESR - 20  MRI cervical 3/23 1. Destruction of the C6 vertebral body with vertebral plana. Anterior and posterior extrusion of this C6 remnants. Evidence of some soft tissue infiltration into the longus coli muscles. Dural thickening and enhancement centered at the C6 level. Top differential considerations are multiple myeloma (favored due to mildly speckled T1 marrow appearance of the upper thoracic vertebrae) and eosinophilic granuloma. An indolent infection such as tuberculosis or actinomycosis is felt less likely. 2. Subsequent C6 level spinal cord compression. No abnormal cord signal. 3. Note also a partially retropharyngeal course of the right carotid.  XR C-Spine 3/23 Complete compression of C6 vertebral body with posterior tilting of C5 most likely impinging upon the spinal canal and cord. Dr. Linna Darner was contacted and the patient will be taken to the emergent department and MRI of the cervical spine performed to assess further.  Paula Compton, MD 05/09/2014, 4:08 PM PGY-1, [facility] Intern pager: [phone], text pages welcome   FPTS Upper-Level Resident Addendum  I have independently interviewed and examined the patient. I have discussed the above with the original author and agree with their documentation. My edits for correction/addition/clarification are in pink. Please see also any attending notes.   Frazier Richards, MD PGY-2, [facility] Service pager: [phone] (text  pages welcome through [facility])

## 2014-05-09 NOTE — ED Notes (Addendum)
Patient back to room from CT, Dr. Gwendlyn Deutscher paged to alert of patients return to room per request

## 2014-05-09 NOTE — ED Notes (Signed)
Per Neurosurgery patient is to remain NPO possible surgery today,.

## 2014-05-09 NOTE — Progress Notes (Signed)
Patient ID: Douglas Curtis, male   DOB: Sep 21, 1975, 39 y.o.   MRN: [id number] MRI is consistent with pathologic destruction of the C6 vertebral body with collapse of the C5 vertebral body into the space where C6 previously resided there is enhancing mass in the prevertebral plate space extending through the vertebral body into the epidural space with cord compression. This is most consistent with tumor or metastatic disease. Possibilities of infection still exists. Extensor gone over the risks and benefits of an anterior cervical corpectomy and fusion with the patient and his wife and they have agreed to proceed forward. We will obtain both specimen for infectious workup as well as cancer workup. We've extensor gone over the risks and benefits perioperative course expectations about: Shows a surgery and he understands and agrees to proceed forward.

## 2014-05-09 NOTE — Patient Instructions (Signed)
You will be taken on an emergent basis to the emergency room at [facility] Dr. Saintclair Halsted is planning to see you in the emergency room.

## 2014-05-09 NOTE — Transfer of Care (Signed)
Immediate Anesthesia Transfer of Care Note  Patient: Douglas Curtis  Procedure(s) Performed: Procedure(s): ANTERIOR CERVICAL CORPECTOMY C5-6, Anterior Cervical Fusion Cervical four - seven. (N/A)  Patient Location: PACU  Anesthesia Type:General  Level of Consciousness: patient cooperative and lethargic  Airway & Oxygen Therapy: Patient Spontanous Breathing and Patient connected to face mask oxygen  Post-op Assessment: Report given to RN, Post -op Vital signs reviewed and stable, Patient moving all extremities and Patient moving all extremities X 4  Post vital signs: Reviewed and stable  Last Vitals:  Filed Vitals:   05/09/14 1430  BP: 141/90  Pulse: 92  Temp:   Resp:     Complications: No apparent anesthesia complications

## 2014-05-09 NOTE — Anesthesia Preprocedure Evaluation (Signed)
Anesthesia Evaluation  Patient identified by MRN, date of birth, ID band Patient awake    Reviewed: Allergy & Precautions, NPO status , Patient's Chart, lab work & pertinent test results  Airway Mallampati: I  TM Distance: >3 FB Neck ROM: Full    Dental  (+) Teeth Intact, Dental Advisory Given   Pulmonary  breath sounds clear to auscultation        Cardiovascular hypertension, Rhythm:Regular Rate:Normal     Neuro/Psych    GI/Hepatic   Endo/Other    Renal/GU      Musculoskeletal   Abdominal   Peds  Hematology   Anesthesia Other Findings   Reproductive/Obstetrics                             Anesthesia Physical Anesthesia Plan  ASA: II  Anesthesia Plan: General   Post-op Pain Management:    Induction: Intravenous  Airway Management Planned: Oral ETT and Video Laryngoscope Planned  Additional Equipment:   Intra-op Plan:   Post-operative Plan: Extubation in OR  Informed Consent: I have reviewed the patients History and Physical, chart, labs and discussed the procedure including the risks, benefits and alternatives for the proposed anesthesia with the patient or authorized representative who has indicated his/her understanding and acceptance.   Dental advisory given  Plan Discussed with: CRNA, Anesthesiologist and Surgeon  Anesthesia Plan Comments:         Anesthesia Quick Evaluation

## 2014-05-09 NOTE — ED Notes (Signed)
Page [phone] Douglas Curtis once patient returned from MRI.

## 2014-05-09 NOTE — ED Notes (Signed)
MD at bedside. 

## 2014-05-09 NOTE — ED Notes (Signed)
RN from neuro OR states they are ready for patient. Advised we do not have an order or consent. Spoke with patient who is aware he is going to surgery. RN aware, states consent can be signed upon patient's arrival.

## 2014-05-09 NOTE — ED Notes (Signed)
Patient transported to MRI 

## 2014-05-09 NOTE — Consult Note (Signed)
Reason for Consult: C6 fracture Referring Physician: Emergency department and Dr. Blair Curtis Douglas Curtis is an 39 y.o. male.  HPI: Patient is a 3 her old gentleman who's had 3 months of neck pain. He initially sought medical attention to [facility] was given anti-inflammatories and sent and a month later he pounds back was evaluated again the ER underwent some cervical spine x-rays and was told he has some degenerative disc disease and patient was placed back on anti-inflammatories and muscle relaxers. About a month ago*experiencing some numbness in his fingers and ultimately presented to Dr. Clayborn Heron office at urgent care today complaining of numbness in his fingers lateral C-spine showed C6 vertebral body destruction with kyphosis and angulation of C5 and C6. The patient be referred at most, ER for evaluation. Currently the patient the plan to neck pain however when he is reclined he's experiencing no numbness or tingling in his arms and hands denies any numbness in his legs denies any difficulty with bowel bladder denies any weakness. He gets some numbness when he is sitting up and leaning forward. It is all confined to the last 3 fingers of each hand. He isn't having neck and shoulder pain and pain in his traps as his only symptoms. He denies any significant past history denies any history of travel who was [place] did serve in the [employer] at [employer] [place].  Past Medical History  Diagnosis Date  . Hypertension     History reviewed. No pertinent past surgical history.  Family History  Problem Relation Age of Onset  . Diabetes Mother   . Hypertension Father   . COPD Sister     Social History:  reports that he has never smoked. He does not have any smokeless tobacco history on file. He reports that he does not drink alcohol or use illicit drugs.  Allergies: No Known Allergies  Medications: I have reviewed the patient's current medications.  Results for orders placed  or performed during the hospital encounter of 05/09/14 (from the past 48 hour(s))  CBC with Differential/Platelet     Status: Abnormal   Collection Time: 05/09/14  1:15 PM  Result Value Ref Range   WBC 8.4 4.0 - 10.5 K/uL   RBC 5.67 4.22 - 5.81 MIL/uL   Hemoglobin 17.3 (H) 13.0 - 17.0 g/dL   HCT 49.1 39.0 - 52.0 %   MCV 86.6 78.0 - 100.0 fL   MCH 30.5 26.0 - 34.0 pg   MCHC 35.2 30.0 - 36.0 g/dL   RDW 12.9 11.5 - 15.5 %   Platelets 341 150 - 400 K/uL   Neutrophils Relative % 67 43 - 77 %   Neutro Abs 5.7 1.7 - 7.7 K/uL   Lymphocytes Relative 26 12 - 46 %   Lymphs Abs 2.2 0.7 - 4.0 K/uL   Monocytes Relative 6 3 - 12 %   Monocytes Absolute 0.5 0.1 - 1.0 K/uL   Eosinophils Relative 1 0 - 5 %   Eosinophils Absolute 0.1 0.0 - 0.7 K/uL   Basophils Relative 0 0 - 1 %   Basophils Absolute 0.0 0.0 - 0.1 K/uL    Dg Cervical Spine Complete  05/09/2014   CLINICAL DATA:  Neck pain radiating between the shoulder blades, some tingling in the hands  EXAM: CERVICAL SPINE  4+ VIEWS  COMPARISON:  None.  FINDINGS: There is complete compression of C6 vertebral body representing vertebra plana. The C5 body is tilted posteriorly and most likely impinges upon  the spinal canal and possibly the cord itself. There is no history of trauma and no prevertebral soft tissue swelling is noted.  I discussed these findings with Dr. Ruben Reason at the time of interpretation. A neck collar has been placed on the patient and EMS will transport the patient to the emergency department. I also suggested that MR of the cervical spine without and with contrast be performed to assess the cord further. Etiologies to consider would be that of the eosinophilic granuloma, multiple myeloma, or possibly metastatic disease.  IMPRESSION: Complete compression of C6 vertebral body with posterior tilting of C5 most likely impinging upon the spinal canal and cord. Dr. Linna Darner was contacted and the patient will be taken to the emergent department  and MRI of the cervical spine performed to assess further.  Critical Value/emergent results were called by telephone at the time of interpretation on 05/09/2014 at 11:17 am to Dr. Shanon Brow HOPPER , who verbally acknowledged these results.   Electronically Signed   By: Ivar Drape M.D.   On: 05/09/2014 11:17    Review of Systems  Constitutional: Negative.   Eyes: Negative.   Respiratory: Negative.   Cardiovascular: Negative.   Gastrointestinal: Negative.   Genitourinary: Negative.   Musculoskeletal: Positive for myalgias and neck pain.  Skin: Negative.   Neurological: Positive for tingling and sensory change.  Endo/Heme/Allergies: Negative.   Psychiatric/Behavioral: Negative.    Blood pressure 150/102, pulse 102, temperature 99.2 F (37.3 C), temperature source Oral, resp. rate 20, height _0  (1.727 m), weight 79.379 kg (175 lb), SpO2 97 %. Physical Exam  Constitutional: He is oriented to person, place, and time. He appears well-developed and well-nourished.  HENT:  Head: Normocephalic.  Eyes: Pupils are equal, round, and reactive to light.  Neck: Normal range of motion.  Respiratory: Effort normal.  GI: Soft. Bowel sounds are normal.  Neurological: He is alert and oriented to person, place, and time. He has normal strength. GCS eye subscore is 4. GCS verbal subscore is 5. GCS motor subscore is 6. He displays Babinski's sign on the left side.  Reflex Scores:      Patellar reflexes are 3+ on the right side and 3+ on the left side.      Achilles reflexes are 3+ on the right side and 3+ on the left side. Patient is awake and alert neck as mentation range of motion but in a cervical collar strength is 5 out of 5 in his deltoid, bicep, tricep, wrist flexion, wrist extension, hand intrinsics. Strength is also 5 out of 5 in his iliopsoas, quads, hamstring and gastric, and EHL. Reflexes are brisk with clonus sensation is grossly intact to light touch and proprioception  Skin: Skin is warm and  dry.    Assessment/Plan: 39 year old gentleman presents with neck pain and shoulder pain and a lateral C-spine and shows near omplete destruction of the C6 vertebral body with angulation C5 and what appeared to be spinal cord compression. Patient will obtain an emergent MRI scan will place the patient on IV Decadron well began a metastatic and infectious workup and more than likely patient will need an anterior cervical corpectomy with posterior augmentation possibly in a delayed fashion. Will have patient seen and hopefully admitted to the internal medicine service for continued workup and evaluation for primary versus metastatic disease and infection.  We'll maintain a cervical collar at all times.   Caila Cirelli P 05/09/2014, 1:33 PM

## 2014-05-09 NOTE — ED Notes (Signed)
Patient in CT at this time.

## 2014-05-09 NOTE — ED Provider Notes (Signed)
CSN: [id number]     Arrival date & time 05/09/14  1216 History   First MD Initiated Contact with Patient 05/09/14 1241     Chief Complaint  Patient presents with  . Spine Injury     (Consider location/radiation/quality/duration/timing/severity/associated sxs/prior Treatment) HPI Comments: 39 yo male presenting from PCPs office after he was found to have compression of his C6 vertebra with apparent spinal cord compression.  He has had no significant injuries, but reports that in November (4 months ago), he began to have neck pain after sleeping on a couch.  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  He has subsequently had progressively worsening pain.  About a week ago, he started to develop tingling in his fingertips on both hands.  This prompted a visit to Dr. Linna Darner.  At this visit, xrays were performed demonstrating the above findings.    Currently, he has moderate, constant, sharp pain in the middle of his neck which gets much worse with palpation.  No weakness or numbness in extremities.     Past Medical History  Diagnosis Date  . Hypertension    History reviewed. No pertinent past surgical history. Family History  Problem Relation Age of Onset  . Diabetes Mother   . Hypertension Father   . COPD Sister    History  Substance Use Topics  . Smoking status: Never Smoker   . Smokeless tobacco: Not on file  . Alcohol Use: No    Review of Systems  All other systems reviewed and are negative.     Allergies  Review of patient's allergies indicates no known allergies.  Home Medications   Prior to Admission medications   Medication Sig Start Date End Date Taking? Authorizing Provider  ibuprofen (ADVIL,MOTRIN) 800 MG tablet Take 800 mg by mouth every 8 (eight) hours as needed.    Historical Provider, MD  naproxen (NAPROSYN) 500 MG tablet Take 500 mg by mouth 2 (two) times daily with a meal.    Historical Provider, MD   BP 138/102 mmHg  Pulse 91   Temp(Src) 99.2 F (37.3 C) (Oral)  Resp 20  Ht _0  (1.727 m)  Wt 175 lb (79.379 kg)  BMI 26.61 kg/m2  SpO2 96% Physical Exam  Constitutional: He is oriented to person, place, and time. He appears well-developed and well-nourished. No distress.  HENT:  Head: Normocephalic and atraumatic.  Eyes: Conjunctivae are normal. No scleral icterus.  Neck: Spinous process tenderness present.  In soft cervical collar. ROM not tested.   Cardiovascular: Normal rate and intact distal pulses.   Pulmonary/Chest: Effort normal. No stridor. No respiratory distress.  Abdominal: Normal appearance. He exhibits no distension.  Musculoskeletal:       Thoracic back: He exhibits no tenderness and no bony tenderness.  Neurological: He is alert and oriented to person, place, and time.  Skin: Skin is warm and dry. No rash noted.  Psychiatric: He has a normal mood and affect. His behavior is normal.  Nursing note and vitals reviewed.   ED Course  Procedures (including critical care time) Labs Review Labs Reviewed - No data to display  Imaging Review Dg Cervical Spine Complete  05/09/2014   CLINICAL DATA:  Neck pain radiating between the shoulder blades, some tingling in the hands  EXAM: CERVICAL SPINE  4+ VIEWS  COMPARISON:  None.  FINDINGS: There is complete compression of C6 vertebral body representing vertebra plana. The C5 body is tilted posteriorly and most  likely impinges upon the spinal canal and possibly the cord itself. There is no history of trauma and no prevertebral soft tissue swelling is noted.  I discussed these findings with Dr. Ruben Reason at the time of interpretation. A neck collar has been placed on the patient and EMS will transport the patient to the emergency department. I also suggested that MR of the cervical spine without and with contrast be performed to assess the cord further. Etiologies to consider would be that of the eosinophilic granuloma, multiple myeloma, or possibly metastatic  disease.  IMPRESSION: Complete compression of C6 vertebral body with posterior tilting of C5 most likely impinging upon the spinal canal and cord. Dr. Linna Darner was contacted and the patient will be taken to the emergent department and MRI of the cervical spine performed to assess further.  Critical Value/emergent results were called by telephone at the time of interpretation on 05/09/2014 at 11:17 am to Dr. Shanon Brow HOPPER , who verbally acknowledged these results.   Electronically Signed   By: Ivar Drape M.D.   On: 05/09/2014 11:17   Ct Chest Wo Contrast  05/09/2014   CLINICAL DATA:  C6 spine fracture of unknown etiology. Question metastatic disease. Evaluate for primary malignancy. Initial encounter.  EXAM: CT CHEST WITHOUT CONTRAST  TECHNIQUE: Multidetector CT imaging of the chest was performed following the standard protocol without IV contrast.  COMPARISON:  MR cervical spine 05/09/2014.  FINDINGS: Mediastinum/Nodes: Likely residual thymus in the prevascular space. No pathologically enlarged mediastinal or axillary lymph nodes. Hilar regions are difficult to definitively evaluate without IV contrast but appear grossly unremarkable. Heart size normal. No pericardial effusion.  Lungs/Pleura: Scattered subpleural nodular densities measure up to 4 mm in the superior segment left lower lobe. Lungs are otherwise clear. No pleural fluid. Airway is unremarkable.  Upper abdomen: Visualized portions of the liver, gallbladder, adrenal glands, kidneys, spleen, pancreas, stomach and bowel are grossly unremarkable. No upper abdominal adenopathy.  Musculoskeletal: No worrisome lytic or sclerotic lesions.  IMPRESSION: 1. No evidence of primary malignancy in the chest. 2. Scattered subpleural pulmonary nodular densities are nonspecific and likely represent subpleural lymph nodes. However, if there is primary malignancy, attention on followup exams is warranted.   Electronically Signed   By: Lorin Picket M.D.   On: 05/09/2014  17:07      EKG Interpretation None      MDM   Final diagnoses:  Pathologic fracture of vertebrae    39 yo male with atraumatic cervical spine compression fracture with spinal cord compromise.  Placed immediately in a rigid cervical collar.  Dr. Saintclair Halsted (Neurosurgery) has seen pt and will take to the OR urgently.  Will discuss with medicine to initiate workup for underlying cause.  ?infectious vs oncologic.  No fevers, no B symptoms.    Family Medicine will admit.    Serita Grit, MD 05/09/14 [phone]

## 2014-05-10 DIAGNOSIS — M8448XG Pathological fracture, other site, subsequent encounter for fracture with delayed healing: Secondary | ICD-10-CM

## 2014-05-10 DIAGNOSIS — S129XXD Fracture of neck, unspecified, subsequent encounter: Secondary | ICD-10-CM

## 2014-05-10 DIAGNOSIS — Z419 Encounter for procedure for purposes other than remedying health state, unspecified: Secondary | ICD-10-CM

## 2014-05-10 DIAGNOSIS — M4852XG Collapsed vertebra, not elsewhere classified, cervical region, subsequent encounter for fracture with delayed healing: Secondary | ICD-10-CM

## 2014-05-10 DIAGNOSIS — R011 Cardiac murmur, unspecified: Secondary | ICD-10-CM

## 2014-05-10 LAB — COMPREHENSIVE METABOLIC PANEL
ALBUMIN: 3.7 g/dL (ref 3.5–5.2)
ALK PHOS: 105 U/L (ref 39–117)
ALT: 52 U/L (ref 0–53)
AST: 30 U/L (ref 0–37)
Anion gap: 9 (ref 5–15)
BUN: 14 mg/dL (ref 6–23)
CALCIUM: 9.2 mg/dL (ref 8.4–10.5)
CO2: 26 mmol/L (ref 19–32)
Chloride: 102 mmol/L (ref 96–112)
Creatinine, Ser: 1.28 mg/dL (ref 0.50–1.35)
GFR calc Af Amer: 82 mL/min — ABNORMAL LOW (ref >90)
GFR calc non Af Amer: 71 mL/min — ABNORMAL LOW (ref >90)
Glucose, Bld: 151 mg/dL — ABNORMAL HIGH (ref 70–99)
POTASSIUM: 4.3 mmol/L (ref 3.5–5.1)
SODIUM: 137 mmol/L (ref 135–145)
Total Bilirubin: 0.6 mg/dL (ref 0.3–1.2)
Total Protein: 7.1 g/dL (ref 6.0–8.3)

## 2014-05-10 LAB — CBC
HCT: 44.4 % (ref 39.0–52.0)
HEMOGLOBIN: 15.4 g/dL (ref 13.0–17.0)
MCH: 30.3 pg (ref 26.0–34.0)
MCHC: 34.7 g/dL (ref 30.0–36.0)
MCV: 87.2 fL (ref 78.0–100.0)
Platelets: 370 10*3/uL (ref 150–400)
RBC: 5.09 MIL/uL (ref 4.22–5.81)
RDW: 12.9 % (ref 11.5–15.5)
WBC: 15.9 10*3/uL — AB (ref 4.0–10.5)

## 2014-05-10 LAB — KAPPA/LAMBDA LIGHT CHAINS
KAPPA, LAMDA LIGHT CHAIN RATIO: 0.77 (ref 0.26–1.65)
Kappa free light chain: 11.6 mg/L (ref 3.30–19.40)
Lambda free light chains: 14.97 mg/L (ref 5.71–26.30)

## 2014-05-10 LAB — FREE PSA
PSA, Free Pct: 18 % — ABNORMAL LOW (ref >25)
PSA, Free: 0.24 ng/mL

## 2014-05-10 LAB — URINALYSIS, ROUTINE W REFLEX MICROSCOPIC
BILIRUBIN URINE: NEGATIVE
Glucose, UA: NEGATIVE mg/dL
Ketones, ur: NEGATIVE mg/dL
Leukocytes, UA: NEGATIVE
Nitrite: NEGATIVE
Protein, ur: NEGATIVE mg/dL
Specific Gravity, Urine: 1.012 (ref 1.005–1.030)
Urobilinogen, UA: 0.2 mg/dL (ref 0.0–1.0)
pH: 6 (ref 5.0–8.0)

## 2014-05-10 LAB — HIV ANTIBODY (ROUTINE TESTING W REFLEX): HIV Screen 4th Generation wRfx: NONREACTIVE

## 2014-05-10 LAB — BETA 2 MICROGLOBULIN, SERUM: Beta-2 Microglobulin: 1.1 mg/L (ref 0.6–2.4)

## 2014-05-10 LAB — HEPATITIS PANEL, ACUTE
HCV Ab: NEGATIVE
HEP A IGM: NONREACTIVE
Hep B C IgM: NONREACTIVE
Hepatitis B Surface Ag: NEGATIVE

## 2014-05-10 LAB — URINE MICROSCOPIC-ADD ON

## 2014-05-10 LAB — PHOSPHORUS: PHOSPHORUS: 5 mg/dL — AB (ref 2.3–4.6)

## 2014-05-10 LAB — PROTIME-INR
INR: 1.38 (ref 0.00–1.49)
Prothrombin Time: 17.1 seconds — ABNORMAL HIGH (ref 11.6–15.2)

## 2014-05-10 LAB — PSA: PSA: 1.37 ng/mL (ref <=4.00)

## 2014-05-10 LAB — TSH: TSH: 2.413 u[IU]/mL (ref 0.350–4.500)

## 2014-05-10 LAB — MRSA PCR SCREENING: MRSA BY PCR: NEGATIVE

## 2014-05-10 LAB — MAGNESIUM: Magnesium: 1.6 mg/dL (ref 1.5–2.5)

## 2014-05-10 LAB — VITAMIN D 25 HYDROXY (VIT D DEFICIENCY, FRACTURES): VIT D 25 HYDROXY: 22.5 ng/mL — AB (ref 30.0–100.0)

## 2014-05-10 MED ORDER — WHITE PETROLATUM GEL
Status: AC
  Administered 2014-05-10: 06:00:00
  Filled 2014-05-10: qty 1

## 2014-05-10 MED ORDER — AMLODIPINE BESYLATE 5 MG PO TABS
5.0000 mg | ORAL_TABLET | Freq: Every day | ORAL | Status: DC
  Administered 2014-05-10 – 2014-05-11 (×2): 5 mg via ORAL
  Filled 2014-05-10 (×2): qty 1

## 2014-05-10 NOTE — Clinical Social Work Note (Signed)
CSW Consult Acknowledged:   CSW received a consult for SNF placement. CSW awaiting PT/OT evaluation to determine the appropriate level of care.      Kamaile Zachow, MSW, LCSWA [phone]  

## 2014-05-10 NOTE — Progress Notes (Signed)
UR COMPLETED  

## 2014-05-10 NOTE — Care Management Note (Unsigned)
    Page 1 of 1   05/10/2014     12:46:49 PM CARE MANAGEMENT NOTE 05/10/2014  Patient:  Douglas Curtis, Douglas Curtis   Account Number:  [id number]  Date Initiated:  05/10/2014  Documentation initiated by:  Whitman Hero  Subjective/Objective Assessment:   PTA from home with wife. Admitted with C- spine compression fracture.     Action/Plan:   Return to home when medically stable. CM to f/u with disposition needs.   Anticipated DC Date:     Anticipated DC Plan:        DC Planning Services  CM consult      Choice offered to / List presented to:             Status of service:  In process, will continue to follow Medicare Important Message given?  NO (If response is "NO", the following Medicare IM given date fields will be blank) Date Medicare IM given:   Medicare IM given by:   Date Additional Medicare IM given:   Additional Medicare IM given by:    Discharge Disposition:    Per UR Regulation:  Reviewed for med. necessity/level of care/duration of stay  If discussed at [place] of Stay Meetings, dates discussed:    Comments:

## 2014-05-10 NOTE — Progress Notes (Signed)
Family Medicine Teaching Service Daily Progress Note Intern Pager: [phone]  Patient name: Douglas Curtis Medical record number: [id number] Date of birth: 20-Aug-1975 Age: 39 y.o. Gender: male  Primary Care Provider: Merry Proud, PA-C Consultants: Neurosurgery  Code Status: Full.   Pt Overview and Major Events to Date:  3/23 - 3/24: Pt. Admitted with pathologic cervical compression fracture. S/P Cervical corpectomy, fusion, and tissue samples.   Assessment and Plan: Douglas Curtis is a 38 y.o. male presenting with C6 vertebral compression fracture and no significant past medical history.   # Cervical Compression Fracture: Pt. Here with pathologic fracture of C6 s/p Cervical corpectomy and fusion. Likely neoplastic at this point. MRI with concern for myeloma, hematologic vs. Primary neoplasm vs. Metastatic disease vs. Infectious process. Sample gram stain without organisms, at this time and ESR 20. Afebrile, WBC post-surgical. 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 Lab results as below. No gamma gap or protein in urine. Awaiting path results. Vital signs stable. Pain controlled.  - Neurosurgery (Dr. Saintclair Halsted) s/p corpectomy and fusion. Anaerobic, Aerobic, AFB cultures obtained. Tissue sample sent to path.  - Surgical path pending.  - Phos slightly elevated (expected). GFR slightly decreased. Calcium continues normal. Pending protein electrophoresis workup, kappa / lambda chains, Gamma Gap 3.4 (marginal).  - CBC / CMP stable. Transaminitis resolved. ALP 105. Hep panel negative.  - HIV pending.  - Peripheral Smear pending.  - CT Chest without contrast with scattered multiple subpleural pulmonary nodules.  - Will hold off on further imaging pending path results.  - PSA normal, TSH normal, LDH normal, B2 Micro 1.1 normal. Mg normal.  - Vit D 22.5.  - Blood Cx NGTD.  - Quant gold for TB given risk factors.  - Will need U/A, UDS - PT/OT today, SCD's only for ppx at  this time per neurosurgery recs.  - Heart Murmur >> known by patient for some time.    # HTN: history of hypertension, not on meds, Continues to be slightly elevated here.  - Added 47m Norvasc for BP control.   FEN/GI: Diet Regular  Prophylaxis: SCDs for now given bleeding risk. Will discuss pharmacologic ppx pending path results.    Disposition: Pending path results and further workup.   Subjective:  No acute events overnight. Pt. With pain well controlled 1-2/10 this am. He says that his numbness / tingling has improved but still with some abnormal sensation in his thumbs and palmar aspect of his thumbs (C5/6). Pt. Reports no weakness this am, fevers, nausea, vomiting, urinary or bowel incontinence.   Objective: Temp:  [97.8 F (36.6 C)-99.2 F (37.3 C)] 97.8 F (36.6 C) (03/24 0737) Pulse Rate:  [77-115] 92 (03/24 0800) Resp:  [15-29] 26 (03/24 0800) BP: (120-167)/(80-108) 143/97 mmHg (03/24 0800) SpO2:  [96 %-100 %] 100 % (03/24 0800) Weight:  [175 lb (79.379 kg)-183 lb 6.8 oz (83.2 kg)] 183 lb 6.8 oz (83.2 kg) (03/23 2335) Physical Exam: General: NAD, AAOx3 Cardiovascular: RRR, 2/6 systolic flow murmur RSB, Normal S1/S2 Respiratory: CTA B, unlabored, appropriate rate.  Abdomen: S, NT, ND, +BS Extremities: WWP, 2+ distal pulses, MAEW.  Neuro: AAOx3, 5/5 motor globally, Decreased sensation to light touch over C5/6 dermatomes bilaterally, but overall improved, No other focal deficits.   Laboratory:  Recent Labs Lab 05/09/14 1009 05/09/14 1315 05/10/14 0335  WBC 9.3 8.4 15.9*  HGB 17.6 17.3* 15.4  HCT 52.6 49.1 44.4  PLT  --  341 370    Recent Labs Lab 05/09/14 1315  05/10/14 0335  NA 142 137  K 4.1 4.3  CL 103 102  CO2 33* 26  BUN 13 14  CREATININE 1.35 1.28  CALCIUM 9.8 9.2  PROT 8.0 7.1  BILITOT 0.7 0.6  ALKPHOS 126* 105  ALT 74* 52  AST 41* 30  GLUCOSE 93 151*   Urinalysis    Component Value Date/Time   COLORURINE YELLOW 05/10/2014 0910    APPEARANCEUR CLEAR 05/10/2014 0910   LABSPEC 1.012 05/10/2014 0910   PHURINE 6.0 05/10/2014 0910   GLUCOSEU NEGATIVE 05/10/2014 0910   HGBUR TRACE* 05/10/2014 0910   BILIRUBINUR NEGATIVE 05/10/2014 0910   KETONESUR NEGATIVE 05/10/2014 0910   PROTEINUR NEGATIVE 05/10/2014 0910   UROBILINOGEN 0.2 05/10/2014 0910   NITRITE NEGATIVE 05/10/2014 0910   LEUKOCYTESUR NEGATIVE 05/10/2014 0910   UDS - pending.   Surgical Path - pending Cultures - pending  EKG - NSR no acute changes.   TSH - 2.413 Phos - 5 Mg - 1.6  ESR - 20  MRI cervical 3/23 1. Destruction of the C6 vertebral body with vertebral plana. Anterior and posterior extrusion of this C6 remnants. Evidence of some soft tissue infiltration into the longus coli muscles. Dural thickening and enhancement centered at the C6 level. Top differential considerations are multiple myeloma (favored due to mildly speckled T1 marrow appearance of the upper thoracic vertebrae) and eosinophilic granuloma. An indolent infection such as tuberculosis or actinomycosis is felt less likely. 2. Subsequent C6 level spinal cord compression. No abnormal cord signal. 3. Note also a partially retropharyngeal course of the right carotid.  XR C-Spine 3/23 Complete compression of C6 vertebral body with posterior tilting of C5 most likely impinging upon the spinal canal and cord. Dr. Linna Darner was contacted and the patient will be taken to the emergent department and MRI of the cervical spine performed to assess further.  CT Chest:  IMPRESSION: 1. No evidence of primary malignancy in the chest. 2. Scattered subpleural pulmonary nodular densities are nonspecific and likely represent subpleural lymph nodes. However, if there is primary malignancy, attention on followup exams is warranted.  Douglas Hacker, MD 05/10/2014, 8:49 AM PGY-1, [place] Intern pager: [phone], text pages welcome

## 2014-05-10 NOTE — Progress Notes (Signed)
Subjective: Patient reports Condition neck pain and some swelling difficulty but numbness improved in his hands.  Objective: Vital signs in last 24 hours: Temp:  [98.4 F (36.9 C)-99.2 F (37.3 C)] 98.4 F (36.9 C) (03/24 0449) Pulse Rate:  [77-115] 109 (03/24 0400) Resp:  [15-29] 20 (03/24 0400) BP: (120-167)/(80-108) 154/108 mmHg (03/24 0400) SpO2:  [96 %-100 %] 100 % (03/24 0400) Weight:  [79.379 kg (175 lb)-83.2 kg (183 lb 6.8 oz)] 83.2 kg (183 lb 6.8 oz) (03/23 2335)  Intake/Output from previous day: 03/23 0701 - 03/24 0700 In: 3240 [P.O.:240; I.V.:3000] Out: 1410 [Urine:1175; Drains:35; Blood:200] Intake/Output this shift: Total I/O In: 0630 [P.O.:240; I.V.:3000] Out: 1060 [Urine:825; Drains:35; Blood:200]  Strength out of 5 wound clean dry and intact  Lab Results:  Recent Labs  05/09/14 1315 05/10/14 0335  WBC 8.4 15.9*  HGB 17.3* 15.4  HCT 49.1 44.4  PLT 341 370   BMET  Recent Labs  05/09/14 1315 05/10/14 0335  NA 142 137  K 4.1 4.3  CL 103 102  CO2 33* 26  GLUCOSE 93 151*  BUN 13 14  CREATININE 1.35 1.28  CALCIUM 9.8 9.2    Studies/Results: Dg Cervical Spine 1 View  05/09/2014   CLINICAL DATA:  Status post anterior cervical corpectomy at C5-C6, with anterior cervical spinal fusion at C4-C7. Initial encounter.  EXAM: DG CERVICAL SPINE - 1 VIEW; DG C-ARM 61-120 MIN  COMPARISON:  MRI of the cervical spine performed earlier today at 3:01 p.m.  FINDINGS: A single fluoroscopic C-arm image is provided from the OR. The patient is status post anterior cervical spinal fusion at C4-C7. There is slight apparent posterior displacement of the posterior elements of C6, though they otherwise demonstrate grossly normal alignment.  IMPRESSION: Status post anterior cervical spinal fusion at C4-C7. Slight apparent posterior displacement of the posterior elements of C6, likely still within normal limits.   Electronically Signed   By: Garald Balding M.D.   On: 05/09/2014  22:44   Dg Cervical Spine Complete  05/09/2014   CLINICAL DATA:  Neck pain radiating between the shoulder blades, some tingling in the hands  EXAM: CERVICAL SPINE  4+ VIEWS  COMPARISON:  None.  FINDINGS: There is complete compression of C6 vertebral body representing vertebra plana. The C5 body is tilted posteriorly and most likely impinges upon the spinal canal and possibly the cord itself. There is no history of trauma and no prevertebral soft tissue swelling is noted.  I discussed these findings with Dr. Ruben Reason at the time of interpretation. A neck collar has been placed on the patient and EMS will transport the patient to the emergency department. I also suggested that MR of the cervical spine without and with contrast be performed to assess the cord further. Etiologies to consider would be that of the eosinophilic granuloma, multiple myeloma, or possibly metastatic disease.  IMPRESSION: Complete compression of C6 vertebral body with posterior tilting of C5 most likely impinging upon the spinal canal and cord. Dr. Linna Darner was contacted and the patient will be taken to the emergent department and MRI of the cervical spine performed to assess further.  Critical Value/emergent results were called by telephone at the time of interpretation on 05/09/2014 at 11:17 am to Dr. Shanon Brow HOPPER , who verbally acknowledged these results.   Electronically Signed   By: Ivar Drape M.D.   On: 05/09/2014 11:17   Ct Chest Wo Contrast  05/09/2014   CLINICAL DATA:  C6 spine fracture of unknown  etiology. Question metastatic disease. Evaluate for primary malignancy. Initial encounter.  EXAM: CT CHEST WITHOUT CONTRAST  TECHNIQUE: Multidetector CT imaging of the chest was performed following the standard protocol without IV contrast.  COMPARISON:  MR cervical spine 05/09/2014.  FINDINGS: Mediastinum/Nodes: Likely residual thymus in the prevascular space. No pathologically enlarged mediastinal or axillary lymph nodes. Hilar  regions are difficult to definitively evaluate without IV contrast but appear grossly unremarkable. Heart size normal. No pericardial effusion.  Lungs/Pleura: Scattered subpleural nodular densities measure up to 4 mm in the superior segment left lower lobe. Lungs are otherwise clear. No pleural fluid. Airway is unremarkable.  Upper abdomen: Visualized portions of the liver, gallbladder, adrenal glands, kidneys, spleen, pancreas, stomach and bowel are grossly unremarkable. No upper abdominal adenopathy.  Musculoskeletal: No worrisome lytic or sclerotic lesions.  IMPRESSION: 1. No evidence of primary malignancy in the chest. 2. Scattered subpleural pulmonary nodular densities are nonspecific and likely represent subpleural lymph nodes. However, if there is primary malignancy, attention on followup exams is warranted.   Electronically Signed   By: Lorin Picket M.D.   On: 05/09/2014 17:07   Mr Cervical Spine W Wo Contrast  05/09/2014   CLINICAL DATA:  39 year old male with C6 vertebra plana discovered on cervical spine radiographs. Neck pain radiating between the shoulder blades. No recent injury. Initial encounter.  EXAM: MRI CERVICAL SPINE WITHOUT AND WITH CONTRAST  TECHNIQUE: Multiplanar and multiecho pulse sequences of the cervical spine, to include the craniocervical junction and cervicothoracic junction, were obtained according to standard protocol without and with intravenous contrast.  CONTRAST:  73m MULTIHANCE GADOBENATE DIMEGLUMINE 529 MG/ML IV SOLN  COMPARISON:  Cervical spine radiographs 0859 hours today.  FINDINGS: Near complete destruction of the C6 vertebral body vertebra plana. There is anterior and posterior displacement of residual vertebral body with diffusely abnormal marrow signal and heterogeneous enhancement. Along the anterior extent there is evidence of additional soft tissue infiltration into the bilateral longus coli muscles. There is a small volume of prevertebral fluid tracking  cephalad to the C3-C4 level. There are areas of preserved normal C5-C6 and C6-C7 disc, although much of the C5-C6 disc appears destroyed. The C5 vertebral body remains largely intact although there is abnormal signal along the anterior inferior endplate.  Secondary to the destruction of C6 there is focal kyphosis and retrolisthesis of the cervical spine. There is multifactorial spinal stenosis with cord compression maximal at the C6 level. No abnormal spinal cord signal is identified.  There is also ventral and dorsal dural thickening and enhancement centered at the C6 level but extending both cephalad and caudally about 1 vertebral body length.  Bone marrow signal in the other cervical vertebrae is preserved; there is mildly heterogeneous decreased T1 signal in the upper thoracic spine, but no destructive bone lesion identified elsewhere.  Both vertebral arteries superior remain patent. The left vertebral artery may be narrowed by the changes at C6. Incidental partially retropharyngeal course. Of the right carotid  Cervicomedullary junction is within normal limits. Negative visualized posterior fossa structures.  IMPRESSION: 1. Destruction of the C6 vertebral body with vertebral plana. Anterior and posterior extrusion of this C6 remnants. Evidence of some soft tissue infiltration into the longus coli muscles. Dural thickening and enhancement centered at the C6 level. Top differential considerations are multiple myeloma (favored due to mildly speckled T1 marrow appearance of the upper thoracic vertebrae) and eosinophilic granuloma. An indolent infection such as tuberculosis or actinomycosis is felt less likely. 2. Subsequent C6 level spinal cord  compression. No abnormal cord signal. 3. Note also a partially retropharyngeal course of the right carotid. Case reviewed in person with Dr. Kary Kos at 1550 hours on 05/09/2014.   Electronically Signed   By: Genevie Ann M.D.   On: 05/09/2014 16:04   Dg C-arm 1-60  Min  05/09/2014   CLINICAL DATA:  Status post anterior cervical corpectomy at C5-C6, with anterior cervical spinal fusion at C4-C7. Initial encounter.  EXAM: DG CERVICAL SPINE - 1 VIEW; DG C-ARM 61-120 MIN  COMPARISON:  MRI of the cervical spine performed earlier today at 3:01 p.m.  FINDINGS: A single fluoroscopic C-arm image is provided from the OR. The patient is status post anterior cervical spinal fusion at C4-C7. There is slight apparent posterior displacement of the posterior elements of C6, though they otherwise demonstrate grossly normal alignment.  IMPRESSION: Status post anterior cervical spinal fusion at C4-C7. Slight apparent posterior displacement of the posterior elements of C6, likely still within normal limits.   Electronically Signed   By: Garald Balding M.D.   On: 05/09/2014 22:44    Assessment/Plan: Mobilize with physical occupational therapy continue medical workup await pathology  LOS: 1 day     Judea Riches P 05/10/2014, 6:30 AM

## 2014-05-10 NOTE — Evaluation (Signed)
Physical Therapy Evaluation Patient Details Name: Douglas Curtis MRN: [id number] DOB: 05-Aug-1975 Today's Date: 05/10/2014   History of Present Illness  pt presents post C5-6 ACDF and Corpectomy due to Tumor infiltration with cord compression and Vertebral bodies pathologic destruction.    Clinical Impression  Pt moving very well despite having tremors during mobility.  Pt indicates tremors have been occuring prior to surgery as well.  Will practice stairs tomorrow with pt and encouraged ambulating with Nsg staff again today.  Will continue to follow.      Follow Up Recommendations No PT follow up;Supervision - Intermittent    Equipment Recommendations  None recommended by PT    Recommendations for Other Services       Precautions / Restrictions Precautions Precautions: Fall;Cervical Required Braces or Orthoses: Cervical Brace Cervical Brace: Hard collar;At all times Restrictions Weight Bearing Restrictions: No      Mobility  Bed Mobility Overal bed mobility: Modified Independent             General bed mobility comments: pt able to perform log roll and come to sitting without A and demos good safety.    Transfers Overall transfer level: Modified independent Equipment used: None                Ambulation/Gait Ambulation/Gait assistance: Supervision Ambulation Distance (Feet): 600 Feet Assistive device: None Gait Pattern/deviations: Step-through pattern;Decreased stride length     General Gait Details: pt with tremors during mobility that improved the longer the pt was ambulating and pt indicated feeling better the more he ambulated.    Stairs            Wheelchair Mobility    Modified Rankin (Stroke Patients Only)       Balance Overall balance assessment: Needs assistance Sitting-balance support: No upper extremity supported;Feet supported Sitting balance-Leahy Scale: Good     Standing balance support: No upper extremity  supported Standing balance-Leahy Scale: Good                               Pertinent Vitals/Pain Pain Assessment: 0-10 Pain Score: 2  Pain Location: neck Pain Descriptors / Indicators: Sore Pain Intervention(s): Monitored during session;Premedicated before session;Repositioned    Home Living Family/patient expects to be discharged to:: Private residence Living Arrangements: Spouse/significant other;Children Available Help at Discharge: Family;Available 24 hours/day Type of Home: House Home Access: Level entry     Home Layout: Two level;1/2 bath on main level;Bed/bath upstairs Home Equipment: None      Prior Function Level of Independence: Independent               Hand Dominance        Extremity/Trunk Assessment   Upper Extremity Assessment: Overall WFL for tasks assessed           Lower Extremity Assessment: Overall WFL for tasks assessed      Cervical / Trunk Assessment: Normal  Communication   Communication: No difficulties  Cognition Arousal/Alertness: Awake/alert Behavior During Therapy: WFL for tasks assessed/performed Overall Cognitive Status: Within Functional Limits for tasks assessed                      General Comments      Exercises        Assessment/Plan    PT Assessment Patient needs continued PT services  PT Diagnosis Difficulty walking   PT Problem List Decreased activity tolerance;Decreased balance;Decreased mobility  PT Treatment  Interventions Gait training;Stair training;Functional mobility training;Therapeutic activities;Therapeutic exercise;Balance training;Neuromuscular re-education;Patient/family education   PT Goals (Current goals can be found in the Care Plan section) Acute Rehab PT Goals Patient Stated Goal: Back to normal! PT Goal Formulation: With patient Time For Goal Achievement: 05/17/14 Potential to Achieve Goals: Good    Frequency Min 5X/week   Barriers to discharge         Co-evaluation               End of Session Equipment Utilized During Treatment: Cervical collar;Gait belt Activity Tolerance: Patient tolerated treatment well Patient left: in chair;with call bell/phone within reach;with family/visitor present Nurse Communication: Mobility status         Time: 1426-1450 PT Time Calculation (min) (ACUTE ONLY): 24 min   Charges:   PT Evaluation $Initial PT Evaluation Tier I: 1 Procedure PT Treatments $Gait Training: 8-22 mins   PT G CodesCatarina Hartshorn, [place] 05/10/2014, 3:53 PM

## 2014-05-10 NOTE — Progress Notes (Signed)
Pt foley removed per MD order. Pt tolerated well. Will continue to monitor.

## 2014-05-11 DIAGNOSIS — I10 Essential (primary) hypertension: Secondary | ICD-10-CM | POA: Diagnosis present

## 2014-05-11 LAB — PROTEIN ELECTROPHORESIS, SERUM
A/G Ratio: 1.1 (ref 0.7–2.0)
ALPHA-1-GLOBULIN: 0.2 g/dL (ref 0.1–0.4)
Albumin ELP: 3.7 g/dL (ref 3.2–5.6)
Alpha-2-Globulin: 0.9 g/dL (ref 0.4–1.2)
Beta Globulin: 1.1 g/dL (ref 0.6–1.3)
GAMMA GLOBULIN: 1.1 g/dL (ref 0.5–1.6)
GLOBULIN, TOTAL: 3.3 g/dL (ref 2.0–4.5)
Total Protein ELP: 7 g/dL (ref 6.0–8.5)

## 2014-05-11 LAB — URINE DRUGS OF ABUSE SCREEN W ALC, ROUTINE (REF LAB)
AMPHETAMINE SCRN UR: NEGATIVE
BARBITURATE QUANT UR: NEGATIVE
BENZODIAZEPINES.: POSITIVE — AB
Cocaine Metabolites: NEGATIVE
Creatinine,U: 86.6 mg/dL
Marijuana Metabolite: NEGATIVE
Methadone: NEGATIVE
OPIATE SCREEN, URINE: NEGATIVE
PROPOXYPHENE: NEGATIVE
Phencyclidine (PCP): NEGATIVE

## 2014-05-11 LAB — QUANTIFERON IN TUBE
QFT TB AG MINUS NIL VALUE: 1.13 IU/mL
QUANTIFERON MITOGEN VALUE: 4.76 [IU]/mL
QUANTIFERON TB AG VALUE: 1.17 IU/mL
QUANTIFERON TB GOLD: POSITIVE — AB
Quantiferon Nil Value: 0.04 IU/mL

## 2014-05-11 LAB — QUANTIFERON TB GOLD ASSAY (BLOOD)

## 2014-05-11 MED ORDER — DIAZEPAM 5 MG PO TABS: 5.0000 mg | ORAL_TABLET | Freq: Four times a day (QID) | ORAL | Status: DC | PRN

## 2014-05-11 MED ORDER — DEXAMETHASONE 1 MG PO TABS: ORAL_TABLET | ORAL | Status: DC

## 2014-05-11 MED ORDER — OXYCODONE-ACETAMINOPHEN 5-325 MG PO TABS: 1.0000 | ORAL_TABLET | Freq: Four times a day (QID) | ORAL | Status: DC | PRN

## 2014-05-11 NOTE — Progress Notes (Signed)
Family Medicine Teaching Service Daily Progress Note Intern Pager: [phone]  Patient name: Douglas Curtis Medical record number: [id number] Date of birth: [birth year] Age: 39 y.o. Gender: male  Primary Care Provider: Merry Proud, PA-C Consultants: Neurosurgery  Code Status: Full.   Pt Overview and Major Events to Date:  3/23 - 3/24: Pt. Admitted with pathologic cervical compression fracture. S/P Cervical corpectomy, fusion, and tissue samples.  3/25: Pain controlled, early ambulation without anticoagulation; work up pending  Assessment and Plan: Douglas Curtis is a 39 y.o. male presenting with pathologic C6 vertebral compression fracture and no significant past medical history.   Pathological C6 compression fracture: S/p corpectomy and fusion 3/23 by Dr. Saintclair Halsted. 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 - Neurosurgery following - Continuing decadron - Anaerobic, Aerobic, AFB cultures obtained. - Surgical path pending.  - SPEP pending, Gamma Gap 3.4 (marginal).  - Peripheral Smear pending.  - Blood Cx NGTD.  - Quant gold for TB given risk factors.  - PT/OT - Percocet 5/376m 1-2 tab q4h prn pain   HTN: history of hypertension, not on meds. Still above goal on norvasc 575mdaily but without symptoms or urgency. - Consider increasing to 1081moon vs. at 2 weeks  Pulmonary nodules: CT Chest showed scattered multiple subpleural pulmonary nodules. Favor LNs over metastases. - Will hold off on further imaging pending path results.   Vitamin D insufficiency Vit D 22.5 - Will begin supplementation, will need recheck in 8 weeks.   Heart Murmur: preexisting, asymptomatic. No work up planned.   FEN/GI: Diet regular  Prophylaxis: SCDs and frequent ambulation given high bleed risk of surgery  Disposition: Pending path results and further workup.   Subjective:  Patient with wife at bedside disclosing well controlled pain. He has been walking around the unit  frequently and denies any radicular symptoms. His sister died of brain CA at age 70 4t no other pertinent FH elicited. No night sweats, weight loss.  Objective: Temp:  [97.8 F (36.6 C)-99.4 F (37.4 C)] 98.8 F (37.1 C) (03/25 1031) Pulse Rate:  [85-107] 98 (03/25 1031) Resp:  [19-31] 20 (03/25 1031) BP: (138-145)/(90-98) 138/92 mmHg (03/25 1031) SpO2:  [98 %-100 %] 100 % (03/25 1031) Weight:  [179 lb 6.4 oz (81.375 kg)] 179 lb 6.4 oz (81.375 kg) (03/24 2004) Physical Exam: General: Well-appearing young man in no distress Neck: Collar in place with anterior bandage clean dry and intact Cardiovascular: RRR, 2/6 soft systolic flow murmur RSB, Normal S1/S2 Respiratory: CTA B, unlabored, appropriate rate, no stridor Abdomen: S, NT, ND, +BS Extremities: WWP, 2+ distal pulses Neuro: AAOx3 upper and lower extremity strength preserved globally, decreased sensation to light touch over C5/6 dermatomes bilaterally. No other focal deficits.   Laboratory:  Recent Labs Lab 05/09/14 1009 05/09/14 1315 05/10/14 0335  WBC 9.3 8.4 15.9*  HGB 17.6 17.3* 15.4  HCT 52.6 49.1 44.4  PLT  --  341 370    Recent Labs Lab 05/09/14 1315 05/10/14 0335  NA 142 137  K 4.1 4.3  CL 103 102  CO2 33* 26  BUN 13 14  CREATININE 1.35 1.28  CALCIUM 9.8 9.2  PROT 8.0 7.1  BILITOT 0.7 0.6  ALKPHOS 126* 105  ALT 74* 52  AST 41* 30  GLUCOSE 93 151*   Urinalysis    Component Value Date/Time   COLORURINE YELLOW 05/10/2014 0910   APPEARANCEUR CLEAR 05/10/2014 0910   LABSPEC 1.012 05/10/2014 0910   PHURINE 6.0 05/10/2014 0910   GLUCOSEU  NEGATIVE 05/10/2014 0910   HGBUR TRACE* 05/10/2014 0910   BILIRUBINUR NEGATIVE 05/10/2014 0910   KETONESUR NEGATIVE 05/10/2014 0910   PROTEINUR NEGATIVE 05/10/2014 0910   UROBILINOGEN 0.2 05/10/2014 0910   NITRITE NEGATIVE 05/10/2014 0910   LEUKOCYTESUR NEGATIVE 05/10/2014 0910   UDS - benzos  Surgical Path - pending Cultures - pending  EKG - NSR no acute  changes.   TSH - 2.413 Phos - 5 Mg - 1.6  ESR - 20  MRI cervical 3/23 1. Destruction of the C6 vertebral body with vertebral plana. Anterior and posterior extrusion of this C6 remnants. Evidence of some soft tissue infiltration into the longus coli muscles. Dural thickening and enhancement centered at the C6 level. Top differential considerations are multiple myeloma (favored due to mildly speckled T1 marrow appearance of the upper thoracic vertebrae) and eosinophilic granuloma. An indolent infection such as tuberculosis or actinomycosis is felt less likely. 2. Subsequent C6 level spinal cord compression. No abnormal cord signal. 3. Note also a partially retropharyngeal course of the right carotid.  XR C-Spine 3/23 Complete compression of C6 vertebral body with posterior tilting of C5 most likely impinging upon the spinal canal and cord. Dr. Linna Darner was contacted and the patient will be taken to the emergent department and MRI of the cervical spine performed to assess further.  CT Chest:  IMPRESSION: 1. No evidence of primary malignancy in the chest. 2. Scattered subpleural pulmonary nodular densities are nonspecific and likely represent subpleural lymph nodes. However, if there is primary malignancy, attention on followup exams is warranted.  Patrecia Pour, MD 05/11/2014, 10:42 AM PGY-2, [place] Intern pager: [phone], text pages welcome

## 2014-05-11 NOTE — Consult Note (Signed)
[place] for Infectious Disease    Date of Admission:  05/09/2014          Reason for Consult: C-6 compression fracture; rule out infection    Referring Physician: Dr. Kary Kos Primary Care Physician: [facility]  Principal Problem:   Compression fracture of cervical spine Active Problems:   Heart murmur   Essential hypertension   . amLODipine  5 mg Oral Daily  .  ceFAZolin (ANCEF) IV  1 g Intravenous Q8H  . dexamethasone  4 mg Intravenous 4 times per day   Or  . dexamethasone  4 mg Oral 4 times per day  . sodium chloride  3 mL Intravenous Q12H    Recommendations: 1. Observe off of antimicrobial therapy for now 2. Await special stains on pathology specimens 3. Await QuantiFERON gold TB assay 4. I have requested PCR testing for Mycobacterium tuberculosis on operative specimen  5. Serum ACE level  Assessment: The preliminary pathology review shows chronic inflammation with granuloma formation compatible with chronic infection. Certainly skeletal tuberculosis (Potts Disease) is a leading consideration. The MRI and operative findings are fairly typical. TB tends to cause destruction of the vertebrae with relative sparing of adjacent disc spaces. The fact that he had negative TB skin test 5-10 years ago does not mean that he has not been exposed more recently during his extensive travel throughout the [place]. The initial AFB stain from the microbiology lab is negative but this test is very insensitive for tuberculosis. I will hold off on starting empiric TB therapy pending further stains on the pathology specimen and PCR testing. Chronic fungal infection and sarcoidosis are less likely considerations. Fortunately, this does not look like tumor.   HPI: Douglas Curtis is a 39 y.o. male who has been in good health until last November when he woke up one morning with a "crick his neck". The pain did not go away so he was seen at the [facility] medical clinic  where an x-ray was done. He was told that he had some arthritis of the spine and was treated with anti-inflammatory medication and muscle relaxers. He was seen there on several occasions. He underwent some physical therapy but continued to have progressive neck pain. He also noted muscle spasms from trying to hold his head and neck still. More recently he began to have some pain in his upper arms and some cold sensation, numbness and tingling in his fingers. His wife encouraged him to see another provider so he went to [facility] where another x-ray was done showing collapse of the C6 vertebrae. He was referred and underwent MRI which showed a near complete destruction of the C6 vertebral body with anterior and posterior extrusion of the C6 remnants.  He underwent emergent anterior cervical corpectomies of C5 and C6 with fusion. Initial impressions suggested pathologic destruction secondary to tumor. Microbiologic stains for AFB are negative. Gram stain is negative. Blood and operative cultures are no growth to date. I have just reviewed the preliminary pathology reviewed with Dr. 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  He has no known exposure to tuberculosis that he is aware of. He served in [employer] and traveled extensively in the [place]. He recalls having 2 negative TB skin test maybe 5-10 years ago. 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 He has not had repeat TB testing and at least 5 years. He has no history of pneumonia. He has never been hospitalized. He has never had previous surgery area and he has no known allergies. He states that he was diagnosed with hypertension many years ago but changed his lifestyle and did not need medications. He is currently taking no prescription medications.  Review of  Systems: Constitutional: positive for Intentional 10 pound weight loss over the past several months. He has noted a few episodes of mild chills since he was hospitalized., negative for anorexia, fevers, night sweats and sweats Eyes: negative Ears, nose, mouth, throat, and face: negative Respiratory: negative Cardiovascular: negative Gastrointestinal: negative Genitourinary:negative  Past Medical History  Diagnosis Date  . Hypertension     History  Substance Use Topics  . Smoking status: Never Smoker   . Smokeless tobacco: Not on file  . Alcohol Use: No    Family History  Problem Relation Age of Onset  . Diabetes Mother   . Hypertension Father   . COPD Sister    No Known Allergies  OBJECTIVE: Blood pressure 139/87, pulse 80, temperature 98.4 F (36.9 C), temperature source Oral, resp. rate 20, height 5' 8"  (1.727 m), weight 179 lb 6.4 oz (81.375 kg), SpO2 100 %.   General: He is very pleasant and healthy-appearing young male. He has a cervical collar on Skin: no rash Lymph nodes: No palpable adenopathy Oral: No oropharyngeal lesions. Teeth are in excellent condition Eyes: Normal external exam Lungs: clear Cor: regular S1 and S2 with a 1/6 systolic murmur Abdomen: soft and nontender. I do not palpate a liver, spleen or other masses Drain in bone graft harvest site  Lab Results Lab Results  Component Value Date   WBC 15.9* 05/10/2014   HGB 15.4 05/10/2014   HCT 44.4 05/10/2014   MCV 87.2 05/10/2014   PLT 370 05/10/2014    Lab Results  Component Value Date   CREATININE 1.28 05/10/2014   BUN 14 05/10/2014   NA 137 05/10/2014   K 4.3 05/10/2014   CL 102 05/10/2014   CO2 26 05/10/2014    Lab Results  Component Value Date   ALT 52 05/10/2014   AST 30 05/10/2014   ALKPHOS 105 05/10/2014   BILITOT 0.6 05/10/2014     Microbiology: Recent Results (from the past 240 hour(s))  Culture, blood (routine x 2)     Status: None (Preliminary result)   Collection  Time: 05/09/14  2:10 PM  Result Value Ref Range Status   Specimen Description BLOOD LEFT ANTECUBITAL  Final   Special Requests BOTTLES DRAWN AEROBIC ONLY 4CC  Final   Culture   Final           BLOOD CULTURE RECEIVED NO GROWTH TO DATE CULTURE WILL BE HELD FOR 5 DAYS BEFORE ISSUING A FINAL NEGATIVE REPORT Performed at [facility]    Report Status PENDING  Incomplete  Culture, blood (routine x 2)     Status: None (Preliminary result)   Collection Time: 05/09/14  4:18 PM  Result Value Ref Range Status   Specimen Description BLOOD RIGHT HAND  Final   Special Requests BOTTLES DRAWN AEROBIC ONLY 5CCS  Final   Culture   Final           BLOOD CULTURE RECEIVED NO GROWTH TO DATE CULTURE WILL BE HELD FOR 5 DAYS BEFORE ISSUING A FINAL NEGATIVE REPORT Performed at [facility]    Report  Status PENDING  Incomplete  Gram stain     Status: None   Collection Time: 05/09/14  6:56 PM  Result Value Ref Range Status   Specimen Description TISSUE  Final   Special Requests CERVICAL MASS  Final   Gram Stain   Final    FEW WBC PRESENT,BOTH PMN AND MONONUCLEAR NO ORGANISMS SEEN    Report Status 05/09/2014 FINAL  Final  Anaerobic culture     Status: None (Preliminary result)   Collection Time: 05/09/14  6:56 PM  Result Value Ref Range Status   Specimen Description TISSUE  Final   Special Requests PATIENT ON FOLLOWING ANCEF CERVICAL MASS  Final   Gram Stain   Final    NO WBC SEEN NO SQUAMOUS EPITHELIAL CELLS SEEN NO ORGANISMS SEEN Performed at [facility]    Culture   Final    NO ANAEROBES ISOLATED; CULTURE IN PROGRESS FOR 5 DAYS Performed at [facility]    Report Status PENDING  Incomplete  Tissue culture     Status: None (Preliminary result)   Collection Time: 05/09/14  6:56 PM  Result Value Ref Range Status   Specimen Description TISSUE  Final   Special Requests PATIENT ON FOLLOWING ANCEF CERVICAL MASS  Final   Gram Stain   Final    NO WBC SEEN NO SQUAMOUS  EPITHELIAL CELLS SEEN NO ORGANISMS SEEN Performed at [facility]    Culture   Final    NO GROWTH 1 DAY Performed at [facility]    Report Status PENDING  Incomplete  AFB culture with smear     Status: None (Preliminary result)   Collection Time: 05/09/14  6:56 PM  Result Value Ref Range Status   Specimen Description TISSUE  Final   Special Requests PATIENT ON FOLLOWING ANCEF CERVICAL MASS  Final   Acid Fast Smear   Final    NO ACID FAST BACILLI SEEN Performed at [facility]    Culture   Final    CULTURE WILL BE EXAMINED FOR 6 WEEKS BEFORE ISSUING A FINAL REPORT Performed at [facility]    Report Status PENDING  Incomplete  MRSA PCR Screening     Status: None   Collection Time: 05/09/14 11:30 PM  Result Value Ref Range Status   MRSA by PCR NEGATIVE NEGATIVE Final    Comment:        The GeneXpert MRSA Assay (FDA approved for NASAL specimens only), is one component of a comprehensive MRSA colonization surveillance program. It is not intended to diagnose MRSA infection nor to guide or monitor treatment for MRSA infections.    MRI CERVICAL SPINE WITHOUT AND WITH CONTRAST 05/09/2014  IMPRESSION: 1. Destruction of the C6 vertebral body with vertebral plana. Anterior and posterior extrusion of this C6 remnants. Evidence of some soft tissue infiltration into the longus coli muscles. Dural thickening and enhancement centered at the C6 level. Top differential considerations are multiple myeloma (favored due to mildly speckled T1 marrow appearance of the upper thoracic vertebrae) and eosinophilic granuloma. An indolent infection such as tuberculosis or actinomycosis is felt less likely. 2. Subsequent C6 level spinal cord compression. No abnormal cord signal. 3. Note also a partially retropharyngeal course of the right carotid. Case reviewed in person with Dr. Kary Kos at 1550 hours on 05/09/2014.   Electronically Signed  By: Genevie Ann M.D.  On: 05/09/2014 16:04  [name], [place] for Infectious Disease [facility] [phone] pager  [phone] cell 05/11/2014, 3:08 PM

## 2014-05-11 NOTE — Progress Notes (Signed)
Physical Therapy Treatment Patient Details Name: Douglas Curtis MRN: [id number] DOB: 12/09/1975 Today's Date: 05/11/2014    History of Present Illness pt presents post C5-6 ACDF and Corpectomy due to Tumor infiltration with cord compression and Vertebral bodies pathologic destruction.      PT Comments    Progressing well. COmpleted steps. Patient safe to D/C from a mobility standpoint based on progression towards goals set on PT eval.    Follow Up Recommendations  No PT follow up;Supervision - Intermittent     Equipment Recommendations  None recommended by PT    Recommendations for Other Services       Precautions / Restrictions Precautions Precautions: Fall;Cervical Required Braces or Orthoses: Cervical Brace Cervical Brace: Hard collar;At all times Restrictions Weight Bearing Restrictions: No    Mobility  Bed Mobility Overal bed mobility: Independent             General bed mobility comments: Pt sitting up in recliner when OT arrived. Pt verbalized log roll technique.   Transfers Overall transfer level: Independent                  Ambulation/Gait Ambulation/Gait assistance: Independent Ambulation Distance (Feet): 600 Feet   Gait Pattern/deviations: WFL(Within Functional Limits)         Stairs Stairs: Yes Stairs assistance: Modified independent (Device/Increase time)   Number of Stairs: 12    Wheelchair Mobility    Modified Rankin (Stroke Patients Only)       Balance                                    Cognition Arousal/Alertness: Awake/alert Behavior During Therapy: WFL for tasks assessed/performed Overall Cognitive Status: Within Functional Limits for tasks assessed                      Exercises      General Comments        Pertinent Vitals/Pain Pain Assessment: No/denies pain    Home Living Family/patient expects to be discharged to:: Private residence Living Arrangements: Spouse/significant  other;Children Available Help at Discharge: Family;Available 24 hours/day Type of Home: House Home Access: Level entry   Home Layout: Two level;1/2 bath on main level;Bed/bath upstairs Home Equipment: None      Prior Function Level of Independence: Independent          PT Goals (current goals can now be found in the care plan section) Progress towards PT goals: Progressing toward goals    Frequency  Min 5X/week    PT Plan Current plan remains appropriate    Co-evaluation             End of Session Equipment Utilized During Treatment: Cervical collar;Gait belt Activity Tolerance: Patient tolerated treatment well Patient left: in chair;with call bell/phone within reach;with family/visitor present     Time: 1005-1016 PT Time Calculation (min) (ACUTE ONLY): 11 min  Charges:  $Gait Training: 8-22 mins                    G Codes:      Jacqualyn Posey 05/11/2014, 12:54 PM 05/11/2014 Jacqualyn Posey PTA [phone] pager [phone] office

## 2014-05-11 NOTE — Discharge Summary (Signed)
Physician Discharge Summary  Patient ID: Douglas Curtis MRN: [id number] DOB/AGE: [birth year] 39 y.o.  Admit date: 05/09/2014 Discharge date: 05/11/2014  Admission Diagnoses: Pathologic fracture of C6 with myelopathy  Discharge Diagnoses: Pathologic fracture C6 with myelopathy. Principal Problem:   Compression fracture of cervical spine Active Problems:   Heart murmur   Essential hypertension   Discharged Condition: good  Hospital Course: She was admitted after having developed some neck pain secondary to an incidentally noted pathologic fracture of the C6 vertebrae. He had high grade spinal cord compression and is becoming myelopathic with dysesthesias in his hands. He was taken to the operating room where he underwent surgical decompression. The lesion was resected in his anterior cervical spine was reconstructed after corpectomy of C6. Final pathologic diagnosis is still awaiting.  Consults: Internal medicine  Significant Diagnostic Studies: Patient has had a multitude of labs sent off for pathologic diagnosis in addition to surgical pathology  Treatments: surgery: Cervical corpectomy of C6 with reconstruction anteriorly.  Discharge Exam: Blood pressure 139/87, pulse 80, temperature 98.4 F (36.9 C), temperature source Oral, resp. rate 20, height 5\' 8"  (1.727 m), weight 81.375 kg (179 lb 6.4 oz), SpO2 100 %. Incision is clean and dry, motor function is intact. Swallow function is normal.  Disposition: Discharge home with outpatient follow-up  Discharge Instructions    Call MD for:  redness, tenderness, or signs of infection (pain, swelling, redness, odor or green/yellow discharge around incision site)    Complete by:  As directed      Call MD for:  severe uncontrolled pain    Complete by:  As directed      Call MD for:  temperature >100.4    Complete by:  As directed      Diet - low sodium heart healthy    Complete by:  As directed      Increase activity slowly    Complete  by:  As directed             Medication List    TAKE these medications        dexamethasone 1 MG tablet  Commonly known as:  DECADRON  2 tablets twice daily for 2 days, one tablet twice daily for 2 days, one tablet daily for 2 days.     diazepam 5 MG tablet  Commonly known as:  VALIUM  Take 1 tablet (5 mg total) by mouth every 6 (six) hours as needed for muscle spasms.     ibuprofen 800 MG tablet  Commonly known as:  ADVIL,MOTRIN  Take 800 mg by mouth every 8 (eight) hours as needed (pain).     naproxen 500 MG tablet  Commonly known as:  NAPROSYN  Take 500 mg by mouth 2 (two) times daily with a meal.     oxyCODONE-acetaminophen 5-325 MG per tablet  Commonly known as:  ROXICET  Take 1-2 tablets by mouth every 6 (six) hours as needed for severe pain.         SignedEarleen Newport 05/11/2014, 3:35 PM

## 2014-05-11 NOTE — Progress Notes (Signed)
Patient discharged home with family. RN discussed prescriptions and discharge instructions with patient. Patient states he understands both instructions. Prescriptions given to patient. JP drain removed. IV removed. Patient driven home by family

## 2014-05-11 NOTE — Evaluation (Signed)
Occupational Therapy Evaluation and Discharge Patient Details Name: Douglas Curtis MRN: [id number] DOB: September 20, 1975 Today's Date: 05/11/2014    History of Present Illness pt presents post C5-6 ACDF and Corpectomy due to Tumor infiltration with cord compression and Vertebral bodies pathologic destruction.     Clinical Impression   PTA pt lived at home and required some assistance for LB ADLs due to stiffness, but was overall independent. Pt currently reports no pain, but is limited by decreased ROM and requires supervision for ADLs. Pt and wife educated on cervical precautions and incorporating into ADLs. No further acute OT needs.      Follow Up Recommendations  No OT follow up;Supervision - Intermittent    Equipment Recommendations  None recommended by OT    Recommendations for Other Services       Precautions / Restrictions Precautions Precautions: Fall;Cervical Required Braces or Orthoses: Cervical Brace Cervical Brace: Hard collar;At all times Restrictions Weight Bearing Restrictions: No      Mobility Bed Mobility               General bed mobility comments: Pt sitting up in recliner when OT arrived. Pt verbalized log roll technique.   Transfers Overall transfer level: Modified independent                         ADL Overall ADL's : Needs assistance/impaired Eating/Feeding: Independent;Sitting   Grooming: Supervision/safety;Standing   Upper Body Bathing: Set up;Sitting   Lower Body Bathing: Set up;Sit to/from stand   Upper Body Dressing : Set up;Sitting   Lower Body Dressing: Set up;Sit to/from stand   Toilet Transfer: Supervision/safety   Toileting- Water quality scientist and Hygiene: Supervision/safety   Tub/ Banker: Supervision/safety   Functional mobility during ADLs: Supervision/safety General ADL Comments: Educated pt on ADLs with use of cervical collar. Educated on donning/doffing and care of collar pads.      Vision  Vision Assessment?: No apparent visual deficits          Pertinent Vitals/Pain Pain Assessment: No/denies pain     Hand Dominance Right   Extremity/Trunk Assessment Upper Extremity Assessment Upper Extremity Assessment: Overall WFL for tasks assessed   Lower Extremity Assessment Lower Extremity Assessment: Overall WFL for tasks assessed   Cervical / Trunk Assessment Cervical / Trunk Assessment: Normal   Communication Communication Communication: No difficulties   Cognition Arousal/Alertness: Awake/alert Behavior During Therapy: WFL for tasks assessed/performed Overall Cognitive Status: Within Functional Limits for tasks assessed                                Home Living Family/patient expects to be discharged to:: Private residence Living Arrangements: Spouse/significant other;Children Available Help at Discharge: Family;Available 24 hours/day Type of Home: House Home Access: Level entry     Home Layout: Two level;1/2 bath on main level;Bed/bath upstairs Alternate Level Stairs-Number of Steps: flight Alternate Level Stairs-Rails: Right     Bathroom Toilet: Standard     Home Equipment: None          Prior Functioning/Environment Level of Independence: Independent             OT Diagnosis: Generalized weakness;Acute pain    End of Session Equipment Utilized During Treatment: Cervical collar  Activity Tolerance: Patient tolerated treatment well Patient left: in chair;with call bell/phone within reach;with family/visitor present   Time: 5956-3875 OT Time Calculation (min): 12 min Charges:  OT General Charges $  OT Visit: 1 Procedure OT Evaluation $Initial OT Evaluation Tier I: 1 Procedure G-Codes:    Juluis Rainier 06/02/2014, 10:52 AM  Cyndie Chime, OTR/L Occupational Therapist [phone] (pager)

## 2014-05-11 NOTE — Discharge Instructions (Signed)
Dexamethasone tablets What is this medicine? DEXAMETHASONE (dex a METH a sone) is a corticosteroid. It is commonly used to treat inflammation of the skin, joints, lungs, and other organs. Common conditions treated include asthma, allergies, and arthritis. It is also used for other conditions, such as blood disorders and diseases of the adrenal glands. This medicine may be used for other purposes; ask your health care provider or pharmacist if you have questions. COMMON BRAND NAME(S): Decadron, DexPak Sterling Big, DexPak TaperPak, Zema-Pak What should I tell my health care provider before I take this medicine? They need to know if you have any of these conditions: -Cushing's syndrome -diabetes -glaucoma -heart problems or disease -high blood pressure -infection like herpes, measles, tuberculosis, or chickenpox -kidney disease -liver disease -mental problems -myasthenia gravis -osteoporosis -previous heart attack -seizures -stomach, ulcer or intestine disease including colitis and diverticulitis -thyroid problem -an unusual or allergic reaction to dexamethasone, corticosteroids, other medicines, lactose, foods, dyes, or preservatives -pregnant or trying to get pregnant -breast-feeding How should I use this medicine? Take this medicine by mouth with a drink of water. Follow the directions on the prescription label. Take it with food or milk to avoid stomach upset. If you are taking this medicine once a day, take it in the morning. Do not take more medicine than you are told to take. Do not suddenly stop taking your medicine because you may develop a severe reaction. Your doctor will tell you how much medicine to take. If your doctor wants you to stop the medicine, the dose may be slowly lowered over time to avoid any side effects. Talk to your pediatrician regarding the use of this medicine in children. Special care may be needed. Patients over 37 years old may have a stronger reaction and  need a smaller dose. Overdosage: If you think you have taken too much of this medicine contact a poison control center or emergency room at once. NOTE: This medicine is only for you. Do not share this medicine with others. What if I miss a dose? If you miss a dose, take it as soon as you can. If it is almost time for your next dose, talk to your doctor or health care professional. You may need to miss a dose or take an extra dose. Do not take double or extra doses without advice. What may interact with this medicine? Do not take this medicine with any of the following medications: -mifepristone, RU-486 -vaccines This medicine may also interact with the following medications: -amphotericin B -antibiotics like clarithromycin, erythromycin, and troleandomycin -aspirin and aspirin-like drugs -barbiturates like phenobarbital -carbamazepine -cholestyramine -cholinesterase inhibitors like donepezil, galantamine, rivastigmine, and tacrine -cyclosporine -digoxin -diuretics -ephedrine -male hormones, like estrogens or progestins and birth control pills -indinavir -isoniazid -ketoconazole -medicines for diabetes -medicines that improve muscle tone or strength for conditions like myasthenia gravis -NSAIDs, medicines for pain and inflammation, like ibuprofen or naproxen -phenytoin -rifampin -thalidomide -warfarin This list may not describe all possible interactions. Give your health care provider a list of all the medicines, herbs, non-prescription drugs, or dietary supplements you use. Also tell them if you smoke, drink alcohol, or use illegal drugs. Some items may interact with your medicine. What should I watch for while using this medicine? Visit your doctor or health care professional for regular checks on your progress. If you are taking this medicine over a prolonged period, carry an identification card with your name and address, the type and dose of your medicine, and your doctor's  name  and address. This medicine may increase your risk of getting an infection. Stay away from people who are sick. Tell your doctor or health care professional if you are around anyone with measles or chickenpox. If you are going to have surgery, tell your doctor or health care professional that you have taken this medicine within the last twelve months. Ask your doctor or health care professional about your diet. You may need to lower the amount of salt you eat. The medicine can increase your blood sugar. If you are a diabetic check with your doctor if you need help adjusting the dose of your diabetic medicine. What side effects may I notice from receiving this medicine? Side effects that you should report to your doctor or health care professional as soon as possible: -allergic reactions like skin rash, itching or hives, swelling of the face, lips, or tongue -changes in vision -fever, sore throat, sneezing, cough, or other signs of infection, wounds that will not heal -increased thirst -mental depression, mood swings, mistaken feelings of self importance or of being mistreated -pain in hips, back, ribs, arms, shoulders, or legs -redness, blistering, peeling or loosening of the skin, including inside the mouth -trouble passing urine or change in the amount of urine -swelling of feet or lower legs -unusual bleeding or bruising Side effects that usually do not require medical attention (report to your doctor or health care professional if they continue or are bothersome): -headache -nausea, vomiting -skin problems, acne, thin and shiny skin -weight gain This list may not describe all possible side effects. Call your doctor for medical advice about side effects. You may report side effects to FDA at 1-800-FDA-1088. Where should I keep my medicine? Keep out of the reach of children. Store at room temperature between 20 and 25 degrees C (68 and 77 degrees F). Protect from light. Throw away any  unused medicine after the expiration date. NOTE: This sheet is a summary. It may not cover all possible information. If you have questions about this medicine, talk to your doctor, pharmacist, or health care provider.  2015, Elsevier/Gold Standard. (2007-05-26 14:02:13)  Diazepam tablets What is this medicine? DIAZEPAM (dye AZ e pam) is a benzodiazepine. It is used to treat anxiety and nervousness. It also can help treat alcohol withdrawal, relax muscles, and treat certain types of seizures. This medicine may be used for other purposes; ask your health care provider or pharmacist if you have questions. COMMON BRAND NAME(S): Valium What should I tell my health care provider before I take this medicine? They need to know if you have any of these conditions -an alcohol or drug abuse problem -bipolar disorder, depression, psychosis or other mental health condition -glaucoma -kidney or liver disease -lung or breathing disease -myasthenia gravis -Parkinson's disease -seizures or a history of seizures -suicidal thoughts -an unusual or allergic reaction to diazepam, other benzodiazepines, foods, dyes, or preservatives -pregnant or trying to get pregnant -breast-feeding How should I use this medicine? Take this medicine by mouth with a glass of water. Follow the directions on the prescription label. If this medicine upsets your stomach, take it with food or milk. Take your doses at regular intervals. Do not take your medicine more often than directed. If you have been taking this medicine regularly for some time, do not suddenly stop taking it. You must gradually reduce the dose or you may get severe side effects. Ask your doctor or health care professional for advice. Even after you stop taking this medicine it  can still affect your body for several days. Talk to your pediatrician regarding the use of this medicine in children. Special care may be needed. Overdosage: If you think you have taken  too much of this medicine contact a poison control center or emergency room at once. NOTE: This medicine is only for you. Do not share this medicine with others. What if I miss a dose? If you miss a dose, take it as soon as you can. If it is almost time for your next dose, take only that dose. Do not take double or extra doses. What may interact with this medicine? -cimetidine -grapefruit juice -herbal or dietary supplements like kava kava, melatonin, St. John's Wort, or valerian -medicines for anxiety or sleeping problems, like alprazolam, lorazepam, or triazolam -medicines for depression, mental problems or psychiatric disturbances -medicines for HIV infection or AIDS -prescription pain medicines -rifampin, rifapentine, or rifabutin -some medicines for seizures like carbamazepine, phenobarbital, phenytoin, or primidone This list may not describe all possible interactions. Give your health care provider a list of all the medicines, herbs, non-prescription drugs, or dietary supplements you use. Also tell them if you smoke, drink alcohol, or use illegal drugs. Some items may interact with your medicine. What should I watch for while using this medicine? Visit your doctor or health care professional for regular checks on your progress. Your body can become dependent on this medicine. Ask your doctor or health care professional if you still need to take it. You may get drowsy or dizzy. Do not drive, use machinery, or do anything that needs mental alertness until you know how this medicine affects you. To reduce the risk of dizzy and fainting spells, do not stand or sit up quickly, especially if you are an older patient. Alcohol may increase dizziness and drowsiness. Avoid alcoholic drinks. Do not treat yourself for coughs, colds or allergies without asking your doctor or health care professional for advice. Some ingredients can increase possible side effects. What side effects may I notice from  receiving this medicine? Side effects that you should report to your doctor or health care professional as soon as possible: -allergic reactions like skin rash, itching or hives, swelling of the face, lips, or tongue -angry, confused, depressed, other mood changes -breathing problems -feeling faint or lightheaded, falls -muscle cramps -problems with balance, talking, walking -restlessness -tremors -trouble passing urine or change in the amount of urine -unusually weak or tired Side effects that usually do not require medical attention (report to your doctor or health care professional if they continue or are bothersome): -difficulty sleeping, nightmares -dizziness, drowsiness, clumsiness, or unsteadiness, a hangover effect -headache -nausea, vomiting This list may not describe all possible side effects. Call your doctor for medical advice about side effects. You may report side effects to FDA at 1-800-FDA-1088. Where should I keep my medicine? Keep out of the reach of children. This medicine can be abused. Keep your medicine in a safe place to protect it from theft. Do not share this medicine with anyone. Selling or giving away this medicine is dangerous and against the law. Store at room temperature between 15 and 30 degrees C (59 and 86 degrees F). Protect from light. Keep container tightly closed. Throw away any unused medicine after the expiration date. NOTE: This sheet is a summary. It may not cover all possible information. If you have questions about this medicine, talk to your doctor, pharmacist, or health care provider.  2015, Elsevier/Gold Standard. (2007-05-23 16:57:35)

## 2014-05-13 LAB — TISSUE CULTURE
CULTURE: NO GROWTH
Gram Stain: NONE SEEN

## 2014-05-14 ENCOUNTER — Telehealth: Payer: Self-pay | Admitting: Family Medicine

## 2014-05-14 ENCOUNTER — Ambulatory Visit (INDEPENDENT_AMBULATORY_CARE_PROVIDER_SITE_OTHER): Payer: BLUE CROSS/BLUE SHIELD | Admitting: Family Medicine

## 2014-05-14 ENCOUNTER — Encounter (HOSPITAL_COMMUNITY): Payer: Self-pay | Admitting: Neurosurgery

## 2014-05-14 VITALS — BP 122/84 | HR 75 | Temp 98.3°F | Resp 16 | Ht 68.0 in | Wt 174.2 lb

## 2014-05-14 DIAGNOSIS — Z111 Encounter for screening for respiratory tuberculosis: Secondary | ICD-10-CM

## 2014-05-14 DIAGNOSIS — S12590S Other displaced fracture of sixth cervical vertebra, sequela: Secondary | ICD-10-CM

## 2014-05-14 DIAGNOSIS — R899 Unspecified abnormal finding in specimens from other organs, systems and tissues: Secondary | ICD-10-CM

## 2014-05-14 DIAGNOSIS — M5412 Radiculopathy, cervical region: Secondary | ICD-10-CM | POA: Diagnosis not present

## 2014-05-14 DIAGNOSIS — R200 Anesthesia of skin: Secondary | ICD-10-CM | POA: Diagnosis not present

## 2014-05-14 LAB — UIFE/LIGHT CHAINS/TP QN, 24-HR UR
ALPHA 2 UR: DETECTED — AB
Albumin, U: DETECTED
Alpha 1, Urine: DETECTED — AB
Beta, Urine: DETECTED — AB
GAMMA UR: DETECTED — AB
Total Protein, Urine: 5 mg/dL (ref 5–25)

## 2014-05-14 LAB — BENZODIAZEPINE, QUANTITATIVE, URINE
Alprazolam metabolite (GC/LC/MS), ur confirm: NEGATIVE ng/mL (ref <25)
CLONAZEPAU: NEGATIVE ng/mL (ref <25)
Flurazepam GC/MS Conf: NEGATIVE ng/mL (ref <50)
Lorazepam (GC/LC/MS), ur confirm: NEGATIVE ng/mL (ref <50)
Midazolam (GC/LC/MS), ur confirm: 230 ng/mL — ABNORMAL HIGH (ref <50)
Nordiazepam GC/MS Conf: NEGATIVE ng/mL (ref <50)
Oxazepam GC/MS Conf: NEGATIVE ng/mL (ref <50)
TEMAZEPAM GC/MS CONF: NEGATIVE ng/mL (ref <50)
TRIAZOLAMU: NEGATIVE ng/mL (ref <50)

## 2014-05-14 LAB — ANAEROBIC CULTURE: Gram Stain: NONE SEEN

## 2014-05-14 NOTE — Patient Instructions (Signed)
Follow-up with Dr. Saintclair Halsted as directed.  Currently your primary thing is to recover from the surgery. The secondary but most important issues will be to get a clear diagnosis on this. That may take some time. If you do not hear from me over the next 7-10 days it back to me.

## 2014-05-14 NOTE — Progress Notes (Signed)
Subjective: 39 year old man who was here last week with a score aids C5 and C6 vertebra. He was sent straight to surgery with a very unstable neck, and had removal of the C5-C6 vertebra. The pathology report appears to be some kind of a granulomatous process. Suspicion for possibility of TB, but no AFB or fungus was seen on the smears. Special studies have been ordered. He was sent home from the hospital after 48 hours, and has not yet seen the neurosurgeon back. Dr. Saintclair Halsted is to see him sometime in the near future. Apparently the pathologist spoke with Dr. Megan Salon, infectious disease, who advised ordering of some of the additional tests.  The patient did have a quantiferon gold done when he went into the hospital which came back positive. He did not have a PPD applied. He has had numerous negative PPDs in the [employer].  His [employer] career has taken throughout the [place] for a number of years.   He feels well since surgery. Has good strength. A little numb tingling in 3 of his fingers.  Objective: Wearing large neck brace. Detailed exam not done. Reviewed current information with him and his wife in a long discussion.  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 Megan Salon, infectious disease specialist. If I cannot reach him tonight I'll try again tomorrow.  PPD  Try to obtain a copy of the old x-ray with report from the [place] for comparison  Long discussion with patient and his wife explaining everything. Workup is in progress.  Patient is to call me back if he does not hear from me in the next 7-10 days.

## 2014-05-14 NOTE — Telephone Encounter (Signed)
Message left with wife about + Quatiferone Gold which indicates previous exposure. AFB still pending. I recommended follow up with his PCP for further management.

## 2014-05-14 NOTE — Progress Notes (Signed)

## 2014-05-15 ENCOUNTER — Telehealth: Payer: Self-pay | Admitting: Family Medicine

## 2014-05-15 LAB — CULTURE, BLOOD (ROUTINE X 2)
CULTURE: NO GROWTH
CULTURE: NO GROWTH

## 2014-05-15 NOTE — Anesthesia Postprocedure Evaluation (Signed)
Anesthesia Post Note  Patient: Douglas Curtis  Procedure(s) Performed: Procedure(s) (LRB): ANTERIOR CERVICAL CORPECTOMY C5-6, Anterior Cervical Fusion Cervical four - seven. (N/A)  Anesthesia type: General  Patient location: PACU  Post pain: Pain level controlled and Adequate analgesia  Post assessment: Post-op Vital signs reviewed, Patient's Cardiovascular Status Stable, Respiratory Function Stable, Patent Airway and Pain level controlled  Last Vitals:  Filed Vitals:   05/11/14 1410  BP: 139/87  Pulse: 80  Temp: 36.9 C  Resp: 20    Post vital signs: Reviewed and stable  Level of consciousness: awake, alert  and oriented  Complications: No apparent anesthesia complications

## 2014-05-15 NOTE — Telephone Encounter (Signed)
I discussed urine electrophoresis result with patient: The SPE pattern appears essentially unremarkable. Evidence of  monoclonal protein is not apparent. Seems to have positive polyclonal increase in free Kappa and/or free Lambda  light chains. Not quite sure what to make of that, but from the report, risk for multiple myeloma is low. Patient instructed to follow up with Dr Huey Bienenstock for further discussion of result and management. He agreed with plan.  I will also forward result to Dr Huey Bienenstock at patient's request.

## 2014-05-16 ENCOUNTER — Other Ambulatory Visit: Payer: Self-pay | Admitting: Internal Medicine

## 2014-05-16 ENCOUNTER — Telehealth: Payer: Self-pay | Admitting: Internal Medicine

## 2014-05-16 DIAGNOSIS — S129XXD Fracture of neck, unspecified, subsequent encounter: Secondary | ICD-10-CM

## 2014-05-16 MED ORDER — VITAMIN B-6 50 MG PO TABS: 50.0000 mg | ORAL_TABLET | Freq: Every day | ORAL | Status: DC

## 2014-05-16 MED ORDER — RIFAMPIN 300 MG PO CAPS: 600.0000 mg | ORAL_CAPSULE | Freq: Every day | ORAL | Status: DC

## 2014-05-16 MED ORDER — ETHAMBUTOL HCL 400 MG PO TABS: 1600.0000 mg | ORAL_TABLET | Freq: Every day | ORAL | Status: DC

## 2014-05-16 MED ORDER — ISONIAZID 300 MG PO TABS: 300.0000 mg | ORAL_TABLET | Freq: Every day | ORAL | Status: DC

## 2014-05-16 MED ORDER — PYRAZINAMIDE 500 MG PO TABS: 2000.0000 mg | ORAL_TABLET | Freq: Every day | ORAL | Status: DC

## 2014-05-16 NOTE — Telephone Encounter (Signed)
I spoke with Mr. Paulsen primary care physician, Dr. Ruben Reason, yesterday and called Mr. Quevedo this morning. He is feeling well and recovering nicely from his recent cervical spine surgery. 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 I recommend starting 4 drug therapy for presumed tuberculosis pending final cultures and PCR report. He is in agreement. He will followup with me next week.

## 2014-05-17 ENCOUNTER — Ambulatory Visit (INDEPENDENT_AMBULATORY_CARE_PROVIDER_SITE_OTHER): Payer: BLUE CROSS/BLUE SHIELD | Admitting: Physician Assistant

## 2014-05-17 DIAGNOSIS — Z111 Encounter for screening for respiratory tuberculosis: Secondary | ICD-10-CM

## 2014-05-17 DIAGNOSIS — Z7689 Persons encountering health services in other specified circumstances: Secondary | ICD-10-CM

## 2014-05-17 LAB — TB SKIN TEST
Induration: 17 mm
TB SKIN TEST: POSITIVE

## 2014-05-17 NOTE — Progress Notes (Signed)
   Subjective:    Patient ID: Douglas Curtis, male    DOB: 03-08-1975, 39 y.o.   MRN: [id number]  HPI  Here today for PPD read. PPD placed 05/14/14 at 7:34pm. 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 He is supposed to start antibiotics with ID today. States he is feeling much better today.  Review of Systems  Musculoskeletal: Positive for neck pain.  Skin: Positive for color change.   Patient Active Problem List   Diagnosis Date Noted  . Essential hypertension   . Surgery, other elective   . Compression fracture of cervical spine 05/09/2014  . Heart murmur 05/09/2014  . Compression fracture of C-spine 05/09/2014  . Open fracture of pedicle of C5 vertebra 05/09/2014  . Cervical spine fracture 05/09/2014  . Pathologic fracture of vertebrae   . Transaminitis   . Polycythemia    Prior to Admission medications   Medication Sig Start Date End Date Taking? Authorizing Provider  dexamethasone (DECADRON) 1 MG tablet 2 tablets twice daily for 2 days, one tablet twice daily for 2 days, one tablet daily for 2 days. 05/11/14   Kristeen Miss, MD  diazepam (VALIUM) 5 MG tablet Take 1 tablet (5 mg total) by mouth every 6 (six) hours as needed for muscle spasms. 05/11/14   Kristeen Miss, MD  ethambutol (MYAMBUTOL) 400 MG tablet Take 4 tablets (1,600 mg total) by mouth daily. 05/16/14   [name], MD  ibuprofen (ADVIL,MOTRIN) 800 MG tablet Take 800 mg by mouth every 8 (eight) hours as needed (pain).     Historical Provider, MD  isoniazid (NYDRAZID) 300 MG tablet Take 1 tablet (300 mg total) by mouth daily. 05/16/14   [name], MD  naproxen (NAPROSYN) 500 MG tablet Take 500 mg by mouth 2 (two) times daily with a meal.    Historical Provider, MD  oxyCODONE-acetaminophen (ROXICET) 5-325 MG per tablet Take 1-2 tablets by mouth every 6 (six) hours as needed for severe pain. 05/11/14   Kristeen Miss, MD  pyrazinamide 500 MG tablet Take 4 tablets (2,000 mg total) by  mouth daily. 05/16/14   [name], MD  pyridOXINE (VITAMIN B-6) 50 MG tablet Take 1 tablet (50 mg total) by mouth daily. 05/16/14   [name], MD  rifampin (RIFADIN) 300 MG capsule Take 2 capsules (600 mg total) by mouth daily. 05/16/14   [name], MD   No Known Allergies  Patient's social and family history were reviewed.     Objective:   Physical Exam  Constitutional: He is oriented to person, place, and time. He appears well-developed and well-nourished. No distress.  HENT:  Head: Normocephalic and atraumatic.  Right Ear: Hearing normal.  Left Ear: Hearing normal.  Nose: Nose normal.  Eyes: Conjunctivae and lids are normal. Right eye exhibits no discharge. Left eye exhibits no discharge. No scleral icterus.  Pulmonary/Chest: Effort normal. No respiratory distress.  Musculoskeletal: Normal range of motion.  Neurological: He is alert and oriented to person, place, and time.  Skin: Skin is warm, dry and intact.  17 mm induration and erythema on right forearm  Psychiatric: He has a normal mood and affect. His speech is normal and behavior is normal. Thought content normal.       Assessment & Plan:  PPD read, within 48-72 hour window PPD positive 94mm induration Positive Quantiferon Gold while in hospital 05/09/14 Infectious Disease already in contact with patient

## 2014-05-18 ENCOUNTER — Encounter (HOSPITAL_COMMUNITY): Payer: Self-pay | Admitting: Neurosurgery

## 2014-05-21 ENCOUNTER — Encounter (HOSPITAL_COMMUNITY): Payer: Self-pay

## 2014-05-23 ENCOUNTER — Ambulatory Visit (INDEPENDENT_AMBULATORY_CARE_PROVIDER_SITE_OTHER): Payer: No Typology Code available for payment source | Admitting: Internal Medicine

## 2014-05-23 VITALS — BP 139/92 | HR 88 | Temp 98.2°F | Ht 68.0 in | Wt 176.5 lb

## 2014-05-23 DIAGNOSIS — A1803 Tuberculosis of other bones: Secondary | ICD-10-CM

## 2014-05-23 NOTE — Progress Notes (Signed)
Patient ID: Douglas Curtis, male   DOB: 1975/05/01, 39 y.o.   MRN: [id number]         [facility]  Patient Active Problem List   Diagnosis Date Noted  . Essential hypertension   . Surgery, other elective   . Compression fracture of cervical spine 05/09/2014  . Heart murmur 05/09/2014  . Compression fracture of C-spine 05/09/2014  . Open fracture of pedicle of C5 vertebra 05/09/2014  . Cervical spine fracture 05/09/2014  . Pathologic fracture of vertebrae   . Transaminitis   . Polycythemia     Patient's Medications  New Prescriptions   No medications on file  Previous Medications   DEXAMETHASONE (DECADRON) 1 MG TABLET    2 tablets twice daily for 2 days, one tablet twice daily for 2 days, one tablet daily for 2 days.   DIAZEPAM (VALIUM) 5 MG TABLET    Take 1 tablet (5 mg total) by mouth every 6 (six) hours as needed for muscle spasms.   ETHAMBUTOL (MYAMBUTOL) 400 MG TABLET    Take 4 tablets (1,600 mg total) by mouth daily.   IBUPROFEN (ADVIL,MOTRIN) 800 MG TABLET    Take 800 mg by mouth every 8 (eight) hours as needed (pain).    ISONIAZID (NYDRAZID) 300 MG TABLET    Take 1 tablet (300 mg total) by mouth daily.   NAPROXEN (NAPROSYN) 500 MG TABLET    Take 500 mg by mouth 2 (two) times daily with a meal.   OXYCODONE-ACETAMINOPHEN (ROXICET) 5-325 MG PER TABLET    Take 1-2 tablets by mouth every 6 (six) hours as needed for severe pain.   PYRAZINAMIDE 500 MG TABLET    Take 4 tablets (2,000 mg total) by mouth daily.   PYRIDOXINE (VITAMIN B-6) 50 MG TABLET    Take 1 tablet (50 mg total) by mouth daily.   RIFAMPIN (RIFADIN) 300 MG CAPSULE    Take 2 capsules (600 mg total) by mouth daily.  Modified Medications   No medications on file  Discontinued Medications   No medications on file    Subjective: Douglas Curtis is in for his hospital follow-up visit. He has had progressive neck pain for the past several months. He eventually underwent cervical MRI which  showed near complete destruction of the C6 vertebral body. He underwent anterior cervical corpectomies of C5 and C6 with strut grafting on 05/09/2014. Pathologic review of operative specimen showed marked chronic lymphohistiocytic inflammation with giant cells and granuloma formation. AFB stains were negative. MTB PCR assay of the operative specimen was negative but the sensitivity of this assay on specimens processed in formalin is markedly reduced. AFB cultures are negative to date. He is now been on empiric isoniazid, rifampin, pyrazinamide and ethambutol along with vitamin B6 for 1 week. He is tolerating his medication well. He states that he is not having any fever. He has no cough. His appetite and weight are normal. His PPD skin test was positive with 17 mm of induration. His Quantferon Gold TB assay was also positive.  Review of Systems: Pertinent items are noted in HPI.  Past Medical History  Diagnosis Date  . Hypertension     History  Substance Use Topics  . Smoking status: Never Smoker   . Smokeless tobacco: Never Used  . Alcohol Use: No    Family History  Problem Relation Age of Onset  . Diabetes Mother   . Hypertension Father   . COPD Sister     No Known  Allergies  Objective: Temp: 98.2 F (36.8 C) (04/06 0840) Temp Source: Oral (04/06 0840) BP: 139/92 mmHg (04/06 0840) Pulse Rate: 88 (04/06 0840)  General: He is very pleasant and in good spirits. He is accompanied by his wife. 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 I will continue empiric for drug TB therapy pending final AFB cultures.  Plan: 1. Continue for drug therapy for probable TB 2. Follow-up in one month   [name], MD [facility] [place] [phone] pager   [phone] cell 05/23/2014, 8:59 AM

## 2014-06-04 ENCOUNTER — Encounter (HOSPITAL_COMMUNITY): Payer: Self-pay | Admitting: Neurosurgery

## 2014-06-26 ENCOUNTER — Ambulatory Visit: Payer: Non-veteran care | Admitting: Internal Medicine

## 2014-07-09 ENCOUNTER — Encounter: Payer: Self-pay | Admitting: Neurosurgery

## 2014-07-19 ENCOUNTER — Encounter: Payer: Self-pay | Admitting: Internal Medicine

## 2014-07-19 ENCOUNTER — Ambulatory Visit (INDEPENDENT_AMBULATORY_CARE_PROVIDER_SITE_OTHER): Payer: No Typology Code available for payment source | Admitting: Internal Medicine

## 2014-07-19 VITALS — BP 127/86 | HR 94 | Temp 98.3°F | Ht 68.0 in | Wt 182.0 lb

## 2014-07-19 DIAGNOSIS — A1803 Tuberculosis of other bones: Secondary | ICD-10-CM

## 2014-07-19 NOTE — Progress Notes (Signed)
Patient ID: Douglas Curtis, male   DOB: July 29, 1975, 39 y.o.   MRN: [id number]         [facility] for Infectious Disease  Patient Active Problem List   Diagnosis Date Noted  . Essential hypertension   . Surgery, other elective   . Compression fracture of cervical spine 05/09/2014  . Heart murmur 05/09/2014  . Compression fracture of C-spine 05/09/2014  . Open fracture of pedicle of C5 vertebra 05/09/2014  . Cervical spine fracture 05/09/2014  . Pathologic fracture of vertebrae   . Transaminitis   . Polycythemia     Patient's Medications  New Prescriptions   No medications on file  Previous Medications   ETHAMBUTOL (MYAMBUTOL) 400 MG TABLET    Take 4 tablets (1,600 mg total) by mouth daily.   ISONIAZID (NYDRAZID) 300 MG TABLET    Take 1 tablet (300 mg total) by mouth daily.   PYRAZINAMIDE 500 MG TABLET    Take 4 tablets (2,000 mg total) by mouth daily.   PYRIDOXINE (VITAMIN B-6) 50 MG TABLET    Take 1 tablet (50 mg total) by mouth daily.   RIFAMPIN (RIFADIN) 300 MG CAPSULE    Take 2 capsules (600 mg total) by mouth daily.  Modified Medications   No medications on file  Discontinued Medications   DEXAMETHASONE (DECADRON) 1 MG TABLET    2 tablets twice daily for 2 days, one tablet twice daily for 2 days, one tablet daily for 2 days.   DIAZEPAM (VALIUM) 5 MG TABLET    Take 1 tablet (5 mg total) by mouth every 6 (six) hours as needed for muscle spasms.   IBUPROFEN (ADVIL,MOTRIN) 800 MG TABLET    Take 800 mg by mouth every 8 (eight) hours as needed (pain).    NAPROXEN (NAPROSYN) 500 MG TABLET    Take 500 mg by mouth 2 (two) times daily with a meal.   OXYCODONE-ACETAMINOPHEN (ROXICET) 5-325 MG PER TABLET    Take 1-2 tablets by mouth every 6 (six) hours as needed for severe pain.    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 He has now completed 2 months of 4 drug TB therapy. He is not terribly thrilled to be taking some  any pills each day but otherwise is having no problems tolerating his medication. He had a cervical spine x-ray recently in Dr. Windy Carina office and will be undergoing CT scan of his neck next Tuesday at the [facility] clinic. He is hopeful that he will be able to come out of his hard neck brace soon and returned to work.  Review of Systems: Pertinent items are noted in HPI.  Past Medical History  Diagnosis Date  . Hypertension     History  Substance Use Topics  . Smoking status: Never Smoker   . Smokeless tobacco: Never Used  . Alcohol Use: No    Family History  Problem Relation Age of Onset  . Diabetes Mother   . Hypertension Father   . COPD Sister     No Known Allergies  Objective: Temp: 98.3 F (36.8 C) (06/02 1623) Temp Source: Oral (06/02 1623) BP: 127/86 mmHg (06/02 1623) Pulse Rate: 94 (06/02 1623)  General: He is wearing his neck brace. He is alert and in no distress. His spirits are good. He has many excellent questions Skin: No rash Lungs: Clear Cor: Regular S1 and S2 with no murmurs  Lab Results Cervical spine cultures 05/09/2014: Mycobacterium  tuberculosis complex (specimens were sent to the state lab and anabiotic susceptibility results are pending)   Assessment: He is tolerating 4 drug TB therapy well and seems to be making some progress. I'm hopeful that the antibody susceptibility results will be available soon and will allow some simplification of his anabiotic regimen.  Plan: 1. Continue isoniazid, rifampin, ethambutol and pyrazinamide 2. Await results of anabiotic susceptibility results from the state lab 3. Follow-up in 3 weeks   [name], MD [facility] for [place] [phone] pager   [phone] cell 07/19/2014, 5:15 PM

## 2014-08-14 ENCOUNTER — Ambulatory Visit: Payer: Non-veteran care | Admitting: Internal Medicine

## 2014-08-22 LAB — AFB CULTURE WITH SMEAR (NOT AT ARMC): ACID FAST SMEAR: NONE SEEN

## 2014-08-24 ENCOUNTER — Ambulatory Visit (HOSPITAL_BASED_OUTPATIENT_CLINIC_OR_DEPARTMENT_OTHER)
Admission: RE | Admit: 2014-08-24 | Discharge: 2014-08-24 | Disposition: A | Discharge status: Home / Self Care | Payer: Non-veteran care | Source: Ambulatory Visit | Attending: Neurosurgery | Admitting: Neurosurgery

## 2014-08-24 ENCOUNTER — Other Ambulatory Visit (HOSPITAL_BASED_OUTPATIENT_CLINIC_OR_DEPARTMENT_OTHER): Payer: Self-pay | Admitting: Neurosurgery

## 2014-08-24 DIAGNOSIS — A1801 Tuberculosis of spine: Secondary | ICD-10-CM

## 2014-09-07 ENCOUNTER — Telehealth: Payer: Self-pay | Admitting: Family Medicine

## 2014-09-08 NOTE — Telephone Encounter (Signed)
Opened in error

## 2014-10-08 ENCOUNTER — Telehealth: Payer: Self-pay | Admitting: Family Medicine

## 2014-10-14 NOTE — Telephone Encounter (Signed)
Was apparently open by error.

## 2014-11-04 ENCOUNTER — Telehealth: Payer: Self-pay | Admitting: Family Medicine

## 2014-11-08 ENCOUNTER — Telehealth: Payer: Self-pay | Admitting: Internal Medicine

## 2014-11-08 NOTE — Telephone Encounter (Signed)
Patient called stating that he is not feeling well on the antibiotics that he is currently on, and would like to see Dr. Megan Salon regarding the medications and f/u on his infection. He is flying in on 11/30/14 to see his neurologist and would like to see you the same day. He will be flying In and out the same day.  Please advise

## 2014-11-08 NOTE — Telephone Encounter (Signed)
I will add him onto my schedule at 1:45 on Friday, 11/30/2014. Please call the lab [phone]) and asked them to send [place] the antibiotic susceptibility results from the state laboratory on his TB isolate from 05/09/2014.

## 2014-11-09 NOTE — Telephone Encounter (Signed)
I spoke with [facility] and they said all the cultures were negative so there were not any susceptibility test available. The representative did see an Acid Fast test that she is faxing over and it states that there is more testing to follow that was sent to [facility]. She said this will be listed on the fax. (I can call if it has the contact information)

## 2014-11-11 NOTE — Telephone Encounter (Signed)
Chart error

## 2014-11-12 NOTE — Telephone Encounter (Signed)
The AFB culture on 05/09/2014 grew Mycobacterium tuberculosis!

## 2014-11-30 ENCOUNTER — Encounter: Payer: Self-pay | Admitting: Internal Medicine

## 2014-11-30 ENCOUNTER — Ambulatory Visit (INDEPENDENT_AMBULATORY_CARE_PROVIDER_SITE_OTHER): Payer: No Typology Code available for payment source | Admitting: Internal Medicine

## 2014-11-30 VITALS — BP 125/87 | HR 73 | Temp 98.6°F | Ht 68.0 in | Wt 184.5 lb

## 2014-11-30 DIAGNOSIS — R110 Nausea: Secondary | ICD-10-CM | POA: Insufficient documentation

## 2014-11-30 DIAGNOSIS — Z23 Encounter for immunization: Secondary | ICD-10-CM

## 2014-11-30 DIAGNOSIS — A1801 Tuberculosis of spine: Secondary | ICD-10-CM | POA: Insufficient documentation

## 2014-11-30 DIAGNOSIS — R21 Rash and other nonspecific skin eruption: Secondary | ICD-10-CM | POA: Insufficient documentation

## 2014-11-30 DIAGNOSIS — A150 Tuberculosis of lung: Secondary | ICD-10-CM | POA: Diagnosis not present

## 2014-11-30 DIAGNOSIS — A159 Respiratory tuberculosis unspecified: Secondary | ICD-10-CM

## 2014-11-30 MED ORDER — TRIAMCINOLONE ACETONIDE 0.5 % EX CREA
1.0000 | TOPICAL_CREAM | Freq: Two times a day (BID) | CUTANEOUS | Status: DC
Start: 2014-11-30 — End: 2015-05-10

## 2014-11-30 MED ORDER — ONDANSETRON 4 MG PO TBDP: ORAL_TABLET | ORAL | Status: DC

## 2014-11-30 NOTE — Assessment & Plan Note (Signed)
He is having some problems with pill burden and nausea. I will give him some Zofran to take before his TB medicines on Tuesday and Friday. I also suggested that he take the Zofran and his TB medicines before bedtime so that he does not feel bad during the day. He will call me within 10 days to let me know how he is doing.

## 2014-11-30 NOTE — Assessment & Plan Note (Signed)
I'm not certain what is causing his rash. Given that it is very focal in nature I am less inclined to think that it is a drug hypersensitivity rash. Also he seems to think that it is getting better. I will treat him with topical steroids and continue his current TB regimen.

## 2014-11-30 NOTE — Progress Notes (Signed)
Patient ID: Douglas Curtis, male   DOB: September 12, 1975, 39 y.o.   MRN: [id number]         [facility] for Infectious Disease  Patient Active Problem List   Diagnosis Date Noted  . 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 Compression fracture of cervical spine ([place]) 05/09/2014    Priority: High  . Rash 11/30/2014    Priority: Medium  . Nausea without vomiting 11/30/2014    Priority: Medium  . Essential hypertension   . Heart murmur 05/09/2014  . Transaminitis   . Polycythemia     Patient's Medications  New Prescriptions   ONDANSETRON (ZOFRAN-ODT) 4 MG DISINTEGRATING TABLET    Take 1 as needed for nausea   TRIAMCINOLONE CREAM (KENALOG) 0.5 %    Apply 1 application topically 2 (two) times daily.  Previous Medications   ETHAMBUTOL (MYAMBUTOL) 400 MG TABLET    Take 4 tablets (1,600 mg total) by mouth daily.   MOXIFLOXACIN (AVELOX) 400 MG TABLET    Take 400 mg by mouth daily at 8 pm. Take 1 tablet by mouth every Tuesday and Friday   PYRAZINAMIDE 500 MG TABLET    Take 4 tablets (2,000 mg total) by mouth daily.   RIFAMPIN (RIFADIN) 300 MG CAPSULE    Take 2 capsules (600 mg total) by mouth daily.  Modified Medications   No medications on file  Discontinued Medications   ISONIAZID (NYDRAZID) 300 MG TABLET    Take 1 tablet (300 mg total) by mouth daily.   PYRIDOXINE (VITAMIN B-6) 50 MG TABLET    Take 1 tablet (50 mg total) by mouth daily.    Subjective: Douglas Curtis is in for his first visit since June. He had progressive neck pain earlier this year and underwent cervical MRI which showed near complete destruction of the C6 vertebral body. He underwent anterior cervical corpectomies of C5 and C6 with strut grafting on 05/09/2014. Pathologic review of operative specimen showed marked chronic lymphohistiocytic inflammation with giant cells and granuloma formation. AFB stains were negative. MTB PCR assay of the operative specimen was negative but the  sensitivity of this assay on specimens processed in formalin is markedly reduced. His Quantferon Gold TB assay was also positive. He was started on empiric isoniazid, rifampin, pyrazinamide and ethambutol along with vitamin B6. He saw me in clinic for follow-up in June. At that time his final cultures were not available.   After that visit his cultures returned as Mycobacterium tuberculi showing resistance to isoniazid. He was contacted by [place] [facility] TB staff and Douglas Curtis, our state TB director, was consulted. He was changed to a regimen of ethambutol,. Is in a mild, rifampin and moxifloxacin. He eventually transitioned to twice weekly directly observed therapy. He has been back at work and usually traveling out of the area during the week. He has continued to take his medications and has not missed doses but he has been bothered by difficulty swallowing the large number of pills each Tuesday and Friday and has also had some nausea without vomiting. He has not had any fever, chills or sweats. He has not had any diarrhea.  About one month ago he developed a fine raised rash on his face, scalp and posterior neck. It is been just mildly pruritic. He has tried using calamine lotion which does help with the itching. He is also used medicated soaps and salicylic acid. He thinks the rash may be slightly better recently.  He has never had a rash like this before.  Review of Systems: Constitutional: negative Eyes: negative Ears, nose, mouth, throat, and face: negative Respiratory: negative Cardiovascular: negative Gastrointestinal: positive for nausea, negative for abdominal pain, change in bowel habits, constipation, diarrhea and reflux symptoms Genitourinary:negative Integument/breast: positive for rash Musculoskeletal:negative for neck pain Neurological: negative  Past Medical History  Diagnosis Date  . Hypertension     Social History  Substance Use Topics  .  Smoking status: Never Smoker   . Smokeless tobacco: Never Used  . Alcohol Use: No    Family History  Problem Relation Age of Onset  . Diabetes Mother   . Hypertension Father   . COPD Sister     No Known Allergies  Objective: Filed Vitals:   11/30/14 1345  BP: 125/87  Curtis: 73  Temp: 98.6 F (37 C)  TempSrc: Oral  Height: 5\' 8"  (1.727 m)  Weight: 184 lb 8 oz (83.689 kg)   Body mass index is 28.06 kg/(m^2).  General: His weight is up 8 pounds since his last visit Skin: Fine papular (question follicular) rash on cheeks, forehead and posterior neck Neck: Supple Oral: No oropharyngeal lesions Lungs: Clear Cor: Regular S1 and S2 with no murmurs Abdomen: Soft and nontender Neuro: Alert with normal speech and conversation. Strength normal in all extremities. Normal gait Mood: Normal. 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 He is having some difficulty taking and tolerating his medications. He was hoping he could stop after 6 months but I have confirmed with Douglas Curtis, [place] Department of Health TB nurse, that Douglas Curtis has recommended a full 9 month course of therapy. Douglas Curtis is certainly willing to continue treatment for 3 more months.      Relevant Medications   ondansetron (ZOFRAN-ODT) 4 MG disintegrating tablet     Medium   Nausea without vomiting    He is having some problems with pill burden and nausea. I will give him some Zofran to take before his TB medicines on Tuesday and Friday. I also suggested that he take the Zofran and his TB medicines before bedtime so that he does not feel bad during the day. He will call me within 10 days to let me know how he is doing.      Rash  - Primary    I'm not certain what is causing his rash. Given that it is very focal in nature I am less inclined to think that it is a drug hypersensitivity rash. Also he seems to think that it is getting better. I will treat him with topical steroids and continue his current TB regimen.      Relevant Medications   triamcinolone cream (KENALOG) 0.5 %       [name], MD [facility] for Infectious [place] [facility] [phone] pager   [phone] cell 11/30/2014, 4:03 PM

## 2014-11-30 NOTE — Assessment & Plan Note (Signed)
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 He is having some difficulty taking and tolerating his medications. He was hoping he could stop after 6 months but I have confirmed with Douglas Curtis, [place] Department of Health TB nurse, that Douglas Curtis has recommended a full 9 month course of therapy. Douglas Curtis is certainly willing to continue treatment for 3 more months.

## 2014-12-04 ENCOUNTER — Telehealth: Payer: Self-pay | Admitting: Pharmacist Clinician (PhC)/ Clinical Pharmacy Specialist

## 2014-12-04 ENCOUNTER — Other Ambulatory Visit: Payer: Self-pay | Admitting: Pharmacist Clinician (PhC)/ Clinical Pharmacy Specialist

## 2014-12-04 NOTE — Telephone Encounter (Signed)
Dr. Lorenda Cahill has gotten back to [place] about Douglas Curtis case. We are going to stop PZA/Rif and cont with Moxi and EMB for now. We are going to try to bring him back for LFTs monitoring. He is currently out of town so I left him a message to call us back. He'll need to come in once he is back in town for labs. A call was also made to Imagene Sheller about the plan. She is also going to try to call him about the plan. Email and labs were faxed to [facility].

## 2014-12-04 NOTE — Telephone Encounter (Signed)
[place] knows to stop PZA/Rif and cont Moxi/EMB

## 2014-12-04 NOTE — Telephone Encounter (Signed)
Douglas Curtis called back and said that he is in [place]. He is going to check to see if he can get flights and get back to [place] to do a LFT to monitor his trend. He is going to try to call my cell phone.

## 2014-12-07 ENCOUNTER — Telehealth: Payer: Self-pay | Admitting: *Deleted

## 2014-12-07 ENCOUNTER — Other Ambulatory Visit: Payer: Non-veteran care

## 2014-12-07 DIAGNOSIS — A159 Respiratory tuberculosis unspecified: Secondary | ICD-10-CM

## 2014-12-07 LAB — CBC WITH DIFFERENTIAL/PLATELET
BASOS ABS: 0.1 10*3/uL (ref 0.0–0.1)
Basophils Relative: 1 % (ref 0–1)
EOS ABS: 0.2 10*3/uL (ref 0.0–0.7)
EOS PCT: 3 % (ref 0–5)
HCT: 46.7 % (ref 39.0–52.0)
Hemoglobin: 16.5 g/dL (ref 13.0–17.0)
LYMPHS PCT: 38 % (ref 12–46)
Lymphs Abs: 2.4 10*3/uL (ref 0.7–4.0)
MCH: 31.3 pg (ref 26.0–34.0)
MCHC: 35.3 g/dL (ref 30.0–36.0)
MCV: 88.6 fL (ref 78.0–100.0)
MONO ABS: 0.7 10*3/uL (ref 0.1–1.0)
MPV: 9.7 fL (ref 8.6–12.4)
Monocytes Relative: 11 % (ref 3–12)
NEUTROS ABS: 3 10*3/uL (ref 1.7–7.7)
NEUTROS PCT: 47 % (ref 43–77)
PLATELETS: 163 10*3/uL (ref 150–400)
RBC: 5.27 MIL/uL (ref 4.22–5.81)
RDW: 14.5 % (ref 11.5–15.5)
WBC: 6.3 10*3/uL (ref 4.0–10.5)

## 2014-12-07 NOTE — Telephone Encounter (Signed)
Message received from Lyda Jester that Imagene Sheller, RN at [facility] Department is trying to reach [facility] regarding this patient.  RN returned call, left phone numbers for Manuela Schwartz to contact [facility] directly.  RN notified [place].  Per [place], he has spoken with Manuela Schwartz today and answered her question.  RN confirmed that the patient did come today for labs. Landis Gandy, RN

## 2014-12-08 LAB — COMPLETE METABOLIC PANEL WITH GFR
ALT: 838 U/L — ABNORMAL HIGH (ref 9–46)
AST: 410 U/L — AB (ref 10–40)
Albumin: 4 g/dL (ref 3.6–5.1)
Alkaline Phosphatase: 107 U/L (ref 40–115)
BUN: 7 mg/dL (ref 7–25)
CHLORIDE: 104 mmol/L (ref 98–110)
CO2: 27 mmol/L (ref 20–31)
Calcium: 9.3 mg/dL (ref 8.6–10.3)
Creat: 1.04 mg/dL (ref 0.60–1.35)
GFR, Est African American: >89 mL/min (ref >=60)
GFR, Est Non African American: >89 mL/min (ref >=60)
Glucose, Bld: 80 mg/dL (ref 65–99)
Potassium: 4.2 mmol/L (ref 3.5–5.3)
Sodium: 139 mmol/L (ref 135–146)
Total Bilirubin: 2.1 mg/dL — ABNORMAL HIGH (ref 0.2–1.2)
Total Protein: 6.4 g/dL (ref 6.1–8.1)

## 2014-12-10 ENCOUNTER — Other Ambulatory Visit: Payer: Self-pay | Admitting: Internal Medicine

## 2014-12-10 DIAGNOSIS — A1801 Tuberculosis of spine: Secondary | ICD-10-CM

## 2014-12-11 ENCOUNTER — Telehealth: Payer: Self-pay | Admitting: *Deleted

## 2014-12-11 NOTE — Telephone Encounter (Signed)
Needing f/u lab work per Dr. Megan Salon.  Pt had already been contacted by the [facility] Department RN to try to make arrangements where the pt is currently working to have labwork drawn at a [place] or other facility.  Pt will be back in [facility]. Nov. 11.  Made the pt an appt for labwork that morning at 0930.

## 2014-12-14 ENCOUNTER — Telehealth: Payer: Self-pay | Admitting: *Deleted

## 2014-12-14 NOTE — Telephone Encounter (Signed)
[facility] lab is going to be closed when pt had an appt.  Pt agrees to obtain labs at [facility] that same Friday, Nov. 11 at [place].  [facility] can mail the order to his home for him to use.  Please write script and it will be mailed.

## 2014-12-18 ENCOUNTER — Telehealth: Payer: Self-pay | Admitting: *Deleted

## 2014-12-18 NOTE — Telephone Encounter (Signed)
Patient called, stated the triamcinolone cream is not working on his rash, he would like something stronger. Please advise if appropriate. Patient was then transferred to [facility] for clarification of his regimen, as it seems there has been confusion.  He will be home 11/11 and will have labs drawn that day.  Per Lorne Skeens, RN, lab orders were mailed to his [facility] address. Landis Gandy, RN

## 2014-12-19 NOTE — Telephone Encounter (Signed)
He would be very difficult for me to know what is causing this rash and what to do about it at this point since I have not seen it in 3 weeks. Given that the rash is still present and he is off of all TB medications it's unlikely to be a hypersensitivity reaction to one of the meds. I would suggest that he go to a local urgent care in the area where he is currently working to be evaluated.

## 2014-12-20 ENCOUNTER — Telehealth: Payer: Self-pay | Admitting: Internal Medicine

## 2014-12-20 NOTE — Telephone Encounter (Signed)
When [name] saw me on 12/01/2014 his hepatic transaminases were quite elevated. The situation was discussed with Dr. Brigitte Curtis [facility] TB director) and we instructed [name] to discontinue his rifampin and pyrazinamide but continue taking ethambutol and moxifloxacin. He began to develop right upper quadrant pain. He also had continued problems with the rash on his face and scalp. He returned here and had repeat blood work on 12/07/2014 and his liver enzymes remained elevated. Further discussion ensued with Dr. Lorenda Curtis and Dr. Jeremy Curtis ([place] Department of Health Director) and he was instructed to discontinue the ethambutol and moxifloxacin the following day. He is feeling better and his abdominal pain has resolved completely. He states that his rash is 80-90% improved now since stopping all medications. The improvement in his rash suggests that it is probably an adverse drug reaction to one of his TB meds. He will be returning to [facility] on 12/28/2014 for repeat liver enzyme testing.  I discussed this very complicated situation with Dr. Santo Curtis last night. Assuming his liver enzymes return to normal, we would then recommend sequential reintroduction of his medications with repeat labs every 3 days. This cannot be done safely with intermittent blood work when he returns home once a month from where he is currently working in [place], [place].  I told him that the only reasonable options in my opinion would be 1) see if his care can be transferred to the health department in [place] so that he can get more frequent monitoring there and not loose time from work. He was out of work for 9 months during his acute illness and desperately wants to continue working now. 2) have him take a temporary leave of absence and return home so that he can be monitored more closely here in our clinic. I told him that I will discuss this further with Dr. Santo Curtis and Douglas Curtis  ([place] [facility] of [facility] TB nurse) at the [facility] and call him next week. I also told him that it would be preferable to have at least 3 more months of TB therapy, assuming we can get him back on a reasonable regimen without adverse side effects. He was in complete agreement with this approach.

## 2014-12-26 ENCOUNTER — Encounter: Payer: Self-pay | Admitting: Internal Medicine

## 2014-12-28 ENCOUNTER — Other Ambulatory Visit: Payer: Non-veteran care

## 2014-12-31 NOTE — Telephone Encounter (Signed)
Unable to contact patient to follow up on rash. His voicemail is full.

## 2015-01-01 ENCOUNTER — Encounter: Payer: Self-pay | Admitting: Internal Medicine

## 2015-01-02 ENCOUNTER — Telehealth: Payer: Self-pay | Admitting: Internal Medicine

## 2015-01-02 ENCOUNTER — Other Ambulatory Visit: Payer: Self-pay | Admitting: Neurosurgery

## 2015-01-02 DIAGNOSIS — A1801 Tuberculosis of spine: Secondary | ICD-10-CM

## 2015-01-02 NOTE — Telephone Encounter (Signed)
LFTs performed at [facility] lab on 12/27/2014  Bilirubin 1.0 AP  64 AST  41 ALT  55  I spoke to Douglas Curtis by phone this evening. He is feeling much better. He has not had any more abdominal pain and states that his rash is drying up and going away. I have discussed his case with Dr. Jeremy Johann and Dr. Brigitte Pulse. We will have him sequentially restart his TB medications with serial monitoring of his clinical findings and LFTs in order to find out which drugs were causing his rash and hepatitis. He is currently working in [place]. I asked him to restart ethambutol when he returns to [place] and 48 hours. He will be returning home to [facility] on Monday, 01/07/2015. I will arrange a visit with me for clinical examination and lab work on 01/09/2015. If he tolerates ethambutol well I will have him continue it and restart moxifloxacin.

## 2015-01-08 ENCOUNTER — Encounter: Payer: Self-pay | Admitting: Internal Medicine

## 2015-01-09 ENCOUNTER — Telehealth: Payer: Self-pay | Admitting: Pharmacist Clinician (PhC)/ Clinical Pharmacy Specialist

## 2015-01-09 ENCOUNTER — Ambulatory Visit (INDEPENDENT_AMBULATORY_CARE_PROVIDER_SITE_OTHER): Payer: No Typology Code available for payment source | Admitting: Internal Medicine

## 2015-01-09 ENCOUNTER — Encounter: Payer: Self-pay | Admitting: Internal Medicine

## 2015-01-09 VITALS — BP 137/88 | HR 79 | Temp 98.3°F | Wt 180.5 lb

## 2015-01-09 DIAGNOSIS — M4852XD Collapsed vertebra, not elsewhere classified, cervical region, subsequent encounter for fracture with routine healing: Secondary | ICD-10-CM | POA: Diagnosis not present

## 2015-01-09 DIAGNOSIS — S129XXD Fracture of neck, unspecified, subsequent encounter: Secondary | ICD-10-CM

## 2015-01-09 DIAGNOSIS — R740 Nonspecific elevation of levels of transaminase and lactic acid dehydrogenase [LDH]: Secondary | ICD-10-CM

## 2015-01-09 DIAGNOSIS — A1801 Tuberculosis of spine: Secondary | ICD-10-CM

## 2015-01-09 DIAGNOSIS — R7401 Elevation of levels of liver transaminase levels: Secondary | ICD-10-CM

## 2015-01-09 LAB — COMPREHENSIVE METABOLIC PANEL
ALT: 23 U/L (ref 9–46)
AST: 23 U/L (ref 10–40)
Albumin: 4.2 g/dL (ref 3.6–5.1)
Alkaline Phosphatase: 62 U/L (ref 40–115)
BILIRUBIN TOTAL: 0.6 mg/dL (ref 0.2–1.2)
BUN: 13 mg/dL (ref 7–25)
CHLORIDE: 103 mmol/L (ref 98–110)
CO2: 26 mmol/L (ref 20–31)
CREATININE: 1.08 mg/dL (ref 0.60–1.35)
Calcium: 9.5 mg/dL (ref 8.6–10.3)
GLUCOSE: 74 mg/dL (ref 65–99)
Potassium: 4.4 mmol/L (ref 3.5–5.3)
SODIUM: 139 mmol/L (ref 135–146)
Total Protein: 6.9 g/dL (ref 6.1–8.1)

## 2015-01-09 MED ORDER — ETHAMBUTOL HCL 400 MG PO TABS: 4000.0000 mg | ORAL_TABLET | Freq: Every day | ORAL | Status: DC

## 2015-01-09 MED ORDER — MOXIFLOXACIN HCL 400 MG PO TABS: 400.0000 mg | ORAL_TABLET | Freq: Every day | ORAL | Status: DC

## 2015-01-09 NOTE — Assessment & Plan Note (Signed)
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 Both of these did not begin to improve until he stopped all 4 medications that he had been taking (ethambutol, moxifloxacin, rifampin and pyrazinamide). Therefore, at this point it is difficult to know which one(s) are the culprits. He seems to be tolerating ethambutol well. I will repeat liver enzymes today. If they do not go up I will have him add moxifloxacin back tomorrow. He will follow-up next week.

## 2015-01-09 NOTE — Progress Notes (Signed)
[facility] for Infectious Disease  Patient Active Problem List   Diagnosis Date Noted  . 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 Compression fracture of cervical spine ([place]) 05/09/2014    Priority: High  . Rash 11/30/2014    Priority: Medium  . Nausea without vomiting 11/30/2014    Priority: Medium  . Essential hypertension   . Heart murmur 05/09/2014  . Transaminitis   . Polycythemia     Patient's Medications  New Prescriptions   No medications on file  Previous Medications   ONDANSETRON (ZOFRAN-ODT) 4 MG DISINTEGRATING TABLET    Take 1 as needed for nausea   TRIAMCINOLONE CREAM (KENALOG) 0.5 %    Apply 1 application topically 2 (two) times daily.  Modified Medications   Modified Medication Previous Medication   ETHAMBUTOL (MYAMBUTOL) 400 MG TABLET ethambutol (MYAMBUTOL) 400 MG tablet      Take 10 tablets (4,000 mg total) by mouth daily. Take 10 tablets by mouth every Tuesday and Friday    Take 4 tablets (1,600 mg total) by mouth daily.   MOXIFLOXACIN (AVELOX) 400 MG TABLET moxifloxacin (AVELOX) 400 MG tablet      Take 1 tablet (400 mg total) by mouth daily at 8 pm. Take 1 tablet by mouth every Tuesday and Friday    Take 400 mg by mouth daily at 8 pm. Take 1 tablet by mouth every Tuesday and Friday  Discontinued Medications   No medications on file    Subjective: Deny is in for his routine follow-up visit. His right upper quadrant pain has resolved and he notes that his facial rash is markedly improved since stopping his TB medications. Repeat lab work on 12/27/2014 showed marked improvement in his liver enzymes. His bilirubin and alkaline phosphatase had normalized. His AST was only slightly elevated at 41 and his ALT was only slightly elevated at 55. He restarted ethambutol on 01/04/2015 and has not noted any new problems.  Review of Systems: Review of Systems  Constitutional: Negative for fever, chills, weight loss  and diaphoresis.  Respiratory: Negative for cough, sputum production and shortness of breath.   Gastrointestinal: Negative for abdominal pain.  Musculoskeletal: Negative for neck pain.  Skin: Positive for rash. Negative for itching.    Past Medical History  Diagnosis Date  . Hypertension     Social History  Substance Use Topics  . Smoking status: Never Smoker   . Smokeless tobacco: Never Used  . Alcohol Use: No    Family History  Problem Relation Age of Onset  . Diabetes Mother   . Hypertension Father   . COPD Sister     No Known Allergies  Objective: Filed Vitals:   01/09/15 1338  BP: 137/88  Pulse: 79  Temp: 98.3 F (36.8 C)  TempSrc: Oral  Weight: 180 lb 8 oz (81.874 kg)   Body mass index is 27.45 kg/(m^2).  Physical Exam  Constitutional:  He is pleasant and in good spirits as usual. He is accompanied by his wife and young daughter.  HENT:  Mouth/Throat: No oropharyngeal exudate.  Eyes: Conjunctivae are normal.  Cardiovascular: Normal rate and regular rhythm.   No murmur heard. Pulmonary/Chest: Breath sounds normal.  Abdominal: Soft. Bowel sounds are normal. He exhibits no distension. There is no tenderness.  Skin:  His papular rash on his face has improved. He remained slightly hyperpigmented. There are fewer lesions and no pustules.  Psychiatric: Mood and affect  normal.    Lab Results    Problem List Items Addressed This Visit      High   Compression fracture of cervical spine (HCC) - Primary   Relevant Medications   ethambutol (MYAMBUTOL) 400 MG tablet   Tuberculosis, vertebral, diagnosis by culture    History TB treatment has been complicated by medication induced hepatitis and rash. Both of these did not begin to improve until he stopped all 4 medications that he had been taking (ethambutol, moxifloxacin, rifampin and pyrazinamide). Therefore, at this point it is difficult to know which one(s) are the culprits. He seems to be tolerating  ethambutol well. I will repeat liver enzymes today. If they do not go up I will have him add moxifloxacin back tomorrow. He will follow-up next week.         Unprioritized   Transaminitis   Relevant Orders   Comprehensive metabolic panel   Comprehensive metabolic panel       [name], MD [facility] for [place] [phone] pager   [phone] cell 01/09/2015, 2:01 PM

## 2015-01-09 NOTE — Telephone Encounter (Signed)
error 

## 2015-01-14 ENCOUNTER — Other Ambulatory Visit: Payer: Non-veteran care

## 2015-01-14 DIAGNOSIS — R740 Nonspecific elevation of levels of transaminase and lactic acid dehydrogenase [LDH]: Principal | ICD-10-CM

## 2015-01-14 DIAGNOSIS — R7401 Elevation of levels of liver transaminase levels: Secondary | ICD-10-CM

## 2015-01-14 LAB — COMPREHENSIVE METABOLIC PANEL
ALK PHOS: 63 U/L (ref 40–115)
ALT: 23 U/L (ref 9–46)
AST: 26 U/L (ref 10–40)
Albumin: 4.4 g/dL (ref 3.6–5.1)
BUN: 11 mg/dL (ref 7–25)
CO2: 32 mmol/L — ABNORMAL HIGH (ref 20–31)
Calcium: 9.8 mg/dL (ref 8.6–10.3)
Chloride: 103 mmol/L (ref 98–110)
Creat: 1.12 mg/dL (ref 0.60–1.35)
Glucose, Bld: 65 mg/dL (ref 65–99)
POTASSIUM: 4.7 mmol/L (ref 3.5–5.3)
Sodium: 140 mmol/L (ref 135–146)
TOTAL PROTEIN: 6.5 g/dL (ref 6.1–8.1)
Total Bilirubin: 0.6 mg/dL (ref 0.2–1.2)

## 2015-01-15 ENCOUNTER — Encounter: Payer: Self-pay | Admitting: Internal Medicine

## 2015-01-15 ENCOUNTER — Ambulatory Visit
Admission: RE | Admit: 2015-01-15 | Discharge: 2015-01-15 | Disposition: A | Discharge status: Home / Self Care | Payer: Non-veteran care | Source: Ambulatory Visit | Attending: Neurosurgery | Admitting: Neurosurgery

## 2015-01-15 ENCOUNTER — Other Ambulatory Visit: Payer: Self-pay | Admitting: Internal Medicine

## 2015-01-15 ENCOUNTER — Telehealth: Payer: Self-pay | Admitting: Internal Medicine

## 2015-01-15 DIAGNOSIS — S129XXD Fracture of neck, unspecified, subsequent encounter: Secondary | ICD-10-CM

## 2015-01-15 DIAGNOSIS — A1801 Tuberculosis of spine: Secondary | ICD-10-CM

## 2015-01-15 MED ORDER — RIFAMPIN 300 MG PO CAPS: 600.0000 mg | ORAL_CAPSULE | Freq: Every day | ORAL | Status: DC

## 2015-01-15 NOTE — Telephone Encounter (Signed)
CMP     Component Value Date/Time   NA 140 01/14/2015 1604   K 4.7 01/14/2015 1604   CL 103 01/14/2015 1604   CO2 32* 01/14/2015 1604   GLUCOSE 65 01/14/2015 1604   BUN 11 01/14/2015 1604   CREATININE 1.12 01/14/2015 1604   CREATININE 1.28 05/10/2014 0335   CALCIUM 9.8 01/14/2015 1604   PROT 6.5 01/14/2015 1604   ALBUMIN 4.4 01/14/2015 1604   AST 26 01/14/2015 1604   ALT 23 01/14/2015 1604   ALKPHOS 63 01/14/2015 1604   BILITOT 0.6 01/14/2015 1604   GFRNONAA >89 12/07/2014 1002   GFRNONAA 71* 05/10/2014 0335   GFRAA >89 12/07/2014 1002   GFRAA 82* 05/10/2014 0335   I called Douglas Curtis this morning and left a message on his voicemail. His liver enzymes remain normal after restarting ethambutol and moxifloxacin. I distracted him to continue taking both of those medications and to restart his rifampin now. He will come in on 01/21/2015 for repeat lab work.

## 2015-01-16 ENCOUNTER — Other Ambulatory Visit: Payer: Self-pay | Admitting: *Deleted

## 2015-01-16 DIAGNOSIS — S129XXD Fracture of neck, unspecified, subsequent encounter: Secondary | ICD-10-CM

## 2015-01-16 MED ORDER — RIFAMPIN 300 MG PO CAPS: 600.0000 mg | ORAL_CAPSULE | ORAL | Status: DC

## 2015-01-21 ENCOUNTER — Other Ambulatory Visit: Payer: Non-veteran care

## 2015-01-21 DIAGNOSIS — A1801 Tuberculosis of spine: Secondary | ICD-10-CM

## 2015-01-21 LAB — COMPREHENSIVE METABOLIC PANEL
ALT: 21 U/L (ref 9–46)
AST: 24 U/L (ref 10–40)
Albumin: 4.4 g/dL (ref 3.6–5.1)
Alkaline Phosphatase: 62 U/L (ref 40–115)
BUN: 9 mg/dL (ref 7–25)
CALCIUM: 9.7 mg/dL (ref 8.6–10.3)
CO2: 29 mmol/L (ref 20–31)
Chloride: 102 mmol/L (ref 98–110)
Creat: 1.17 mg/dL (ref 0.60–1.35)
GLUCOSE: 89 mg/dL (ref 65–99)
POTASSIUM: 4.9 mmol/L (ref 3.5–5.3)
Sodium: 140 mmol/L (ref 135–146)
Total Bilirubin: 0.6 mg/dL (ref 0.2–1.2)
Total Protein: 6.7 g/dL (ref 6.1–8.1)

## 2015-01-23 ENCOUNTER — Telehealth: Payer: Self-pay | Admitting: Internal Medicine

## 2015-01-23 NOTE — Telephone Encounter (Signed)
Mr. Hogland has tolerated the reintroduction of ethambutol, moxifloxacin and rifampin. His rash has not come back and his LFTs remain normal. I spoke to Dr. Jeremy Johann and Dr. Brigitte Pulse and we willhold off on re-challenging him with pyrazinamide and simply complete 3 more months with his current 3 drug regimen. He will followup with me in clinic in 2 weeks.                                                              11/23                                  11/28                                  12/5 Bilirubin                                                 0.6                                       0.6                                     0.6 Alkaline phosphatase                           62                                         63                                      62 AST                                                      23                                         26                                       24  ALT  23                                         23                                       21 

## 2015-01-23 NOTE — Telephone Encounter (Signed)
Douglas Curtis has tolerated the reintroduction of ethambutol, moxifloxacin and rifampin. His rash has not come back and his LFTs remain normal. I spoke to Dr. Jeremy Johann and Dr. Brigitte Pulse and we will hold off on re-challenging him with pyrazinamide and simply complete 3 more months with his current 3 drug regimen. He will follow-up with me in 2 weeks.                                                              11/23                                  11/28                                  12/5 Bilirubin                                                0.6                                       0.6                                       0.6 Alkaline phosphatase                          62                                         63                                         62 AST                                                     23                                         26                                         24  ALT  23                                         23                                         21 

## 2015-01-29 ENCOUNTER — Other Ambulatory Visit: Payer: Self-pay | Admitting: Internal Medicine

## 2015-01-29 ENCOUNTER — Other Ambulatory Visit: Payer: Self-pay | Admitting: *Deleted

## 2015-01-29 ENCOUNTER — Encounter: Payer: Self-pay | Admitting: Pharmacist Clinician (PhC)/ Clinical Pharmacy Specialist

## 2015-01-29 ENCOUNTER — Other Ambulatory Visit: Payer: Non-veteran care

## 2015-01-29 DIAGNOSIS — A159 Respiratory tuberculosis unspecified: Secondary | ICD-10-CM

## 2015-01-29 DIAGNOSIS — S129XXD Fracture of neck, unspecified, subsequent encounter: Secondary | ICD-10-CM

## 2015-01-29 DIAGNOSIS — A1801 Tuberculosis of spine: Secondary | ICD-10-CM

## 2015-01-29 LAB — COMPREHENSIVE METABOLIC PANEL
ALBUMIN: 4.3 g/dL (ref 3.6–5.1)
ALT: 18 U/L (ref 9–46)
AST: 25 U/L (ref 10–40)
Alkaline Phosphatase: 59 U/L (ref 40–115)
BUN: 11 mg/dL (ref 7–25)
CHLORIDE: 106 mmol/L (ref 98–110)
CO2: 23 mmol/L (ref 20–31)
Calcium: 9.4 mg/dL (ref 8.6–10.3)
Creat: 1.11 mg/dL (ref 0.60–1.35)
Glucose, Bld: 77 mg/dL (ref 65–99)
POTASSIUM: 4.3 mmol/L (ref 3.5–5.3)
Sodium: 142 mmol/L (ref 135–146)
TOTAL PROTEIN: 6.6 g/dL (ref 6.1–8.1)
Total Bilirubin: 0.6 mg/dL (ref 0.2–1.2)

## 2015-01-29 MED ORDER — RIFAMPIN 300 MG PO CAPS: 600.0000 mg | ORAL_CAPSULE | ORAL | Status: DC

## 2015-01-29 NOTE — Progress Notes (Signed)
Patient ID: Douglas Curtis, male   DOB: Jun 25, 1975, 39 y.o.   MRN: [id number] HPI: Douglas Curtis is a 38 y.o. 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 He was here for a script of one of his meds.   Allergies: No Known Allergies  Vitals:    Past Medical History: Past Medical History  Diagnosis Date  . Hypertension     Social History: Social History   Social History  . Marital Status: Married    Spouse Name: N/A  . Number of Children: N/A  . Years of Education: N/A   Social History Main Topics  . Smoking status: Never Smoker   . Smokeless tobacco: Never Used  . Alcohol Use: No  . Drug Use: No  . Sexual Activity: Not on file   Other Topics Concern  . Not on file   Social History Narrative    Previous Regimen: INH, PZA, RIF, ETB>PZA, RIF, ETB, MOX  Current Regimen: MOX/RIF/ETB  Labs: HEPATITIS B SURFACE AG (no units)  Date Value  05/09/2014 NEGATIVE   HCV AB (no units)  Date Value  05/09/2014 NEGATIVE    CrCl: CrCl cannot be calculated (Unknown ideal weight.).  Lipids: No results found for: CHOL, TRIG, HDL, CHOLHDL, VLDL, LDLCALC  Assessment: 39 yo who was originally started on the 4 drugs regimen for his spinal TB. It was then discovered that he had INH resistant strain. INH was switched to MOX. Subsequently, while on this regimen for about 2 months, he had some severe increase in his LFTs and developed some symptoms. The entire regimen was held after discussion with Dr Lorenda Cahill, we restarted the regimen without PZA after his LFTs normalized. Since he was here, we sat down to discuss his on going therapy and discussed any side effects issue. Ever since the restart, he has been doing well and has not missed any doses. He is feeling better overall. All the doses are correct. His questions were answered.  Recommendations: Handed him the Rx for rifampin  Wilfred Lacy, PharmD Clinical Infectious [place] 01/29/2015, 1:46 PM

## 2015-02-01 NOTE — Progress Notes (Signed)
Error

## 2015-02-06 ENCOUNTER — Ambulatory Visit: Payer: Non-veteran care | Admitting: Internal Medicine

## 2015-02-07 ENCOUNTER — Telehealth: Payer: Self-pay | Admitting: *Deleted

## 2015-02-07 MED ORDER — MOXIFLOXACIN HCL 400 MG PO TABS: 400.0000 mg | ORAL_TABLET | Freq: Every day | ORAL | Status: DC

## 2015-02-07 NOTE — Addendum Note (Signed)
Addended by: [name] on: 02/07/2015 11:12 AM   Modules accepted: Orders

## 2015-02-07 NOTE — Telephone Encounter (Signed)
Patient called Douglas Curtis stating he missed yesterday's appointment and needs a refill on the moxifloxacin, please advise. He has another appt on 02/19/15. Myrtis Hopping

## 2015-02-07 NOTE — Telephone Encounter (Signed)
I sent 3 refills to his pharmacy.

## 2015-02-07 NOTE — Telephone Encounter (Signed)
Patient notified

## 2015-02-19 ENCOUNTER — Telehealth: Payer: Self-pay | Admitting: Internal Medicine

## 2015-02-19 ENCOUNTER — Encounter: Payer: Self-pay | Admitting: Internal Medicine

## 2015-02-19 ENCOUNTER — Ambulatory Visit (INDEPENDENT_AMBULATORY_CARE_PROVIDER_SITE_OTHER): Payer: No Typology Code available for payment source | Admitting: Internal Medicine

## 2015-02-19 DIAGNOSIS — A1801 Tuberculosis of spine: Secondary | ICD-10-CM

## 2015-02-19 LAB — COMPREHENSIVE METABOLIC PANEL
ALBUMIN: 4.8 g/dL (ref 3.6–5.1)
ALT: 17 U/L (ref 9–46)
AST: 24 U/L (ref 10–40)
Alkaline Phosphatase: 66 U/L (ref 40–115)
BILIRUBIN TOTAL: 0.8 mg/dL (ref 0.2–1.2)
BUN: 11 mg/dL (ref 7–25)
CALCIUM: 9.8 mg/dL (ref 8.6–10.3)
CHLORIDE: 100 mmol/L (ref 98–110)
CO2: 26 mmol/L (ref 20–31)
CREATININE: 1.25 mg/dL (ref 0.60–1.35)
Glucose, Bld: 86 mg/dL (ref 65–99)
Potassium: 4.4 mmol/L (ref 3.5–5.3)
SODIUM: 140 mmol/L (ref 135–146)
TOTAL PROTEIN: 7.2 g/dL (ref 6.1–8.1)

## 2015-02-19 NOTE — Progress Notes (Signed)
[facility] for Infectious Disease  Patient Active Problem List   Diagnosis Date Noted  . 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 Compression fracture of cervical spine ([place]) 05/09/2014    Priority: High  . Rash 11/30/2014    Priority: Medium  . Nausea without vomiting 11/30/2014    Priority: Medium  . Essential hypertension   . Heart murmur 05/09/2014  . Transaminitis   . Polycythemia     Patient's Medications  New Prescriptions   No medications on file  Previous Medications   ETHAMBUTOL (MYAMBUTOL) 400 MG TABLET    Take 10 tablets (4,000 mg total) by mouth daily. Take 10 tablets by mouth every Tuesday and Friday   MOXIFLOXACIN (AVELOX) 400 MG TABLET    Take 1 tablet (400 mg total) by mouth daily at 8 pm. Take 1 tablet by mouth every Tuesday and Friday   ONDANSETRON (ZOFRAN-ODT) 4 MG DISINTEGRATING TABLET    Take 1 as needed for nausea   RIFAMPIN (RIFADIN) 300 MG CAPSULE    Take 2 capsules (600 mg total) by mouth as directed. Take 2 capsules by mouth every Tuesday and Friday   TRIAMCINOLONE CREAM (KENALOG) 0.5 %    Apply 1 application topically 2 (two) times daily.  Modified Medications   No medications on file  Discontinued Medications   No medications on file    Subjective: Douglas Curtis is in for his routine follow-up visit. He is accompanied by his wife and young daughter today. He restarted ethambutol, moxifloxacin and rifampin in early December and has had no return of his rash or abdominal pain. She has some mild nausea on the days that he takes his medications but has had no vomiting or other problems tolerating them. His appetite is good. He has no neck pain. He will be flying back to [place] and returning to work full-time today.  Review of Systems: Review of Systems  Constitutional: Positive for malaise/fatigue. Negative for fever, chills, weight loss and diaphoresis.  HENT:          Respiratory: Negative for  cough and sputum production.   Cardiovascular: Negative for chest pain.  Gastrointestinal: Positive for nausea. Negative for heartburn, vomiting, abdominal pain and diarrhea.  Musculoskeletal: Negative for neck pain.  Skin: Negative for itching and rash.  Neurological: Negative for focal weakness.    Past Medical History  Diagnosis Date  . Hypertension     Social History  Substance Use Topics  . Smoking status: Never Smoker   . Smokeless tobacco: Never Used  . Alcohol Use: No    Family History  Problem Relation Age of Onset  . Diabetes Mother   . Hypertension Father   . COPD Sister     No Known Allergies  Objective: Filed Vitals:   02/19/15 1009  BP: 131/97  Pulse: 67  Temp: 98.3 F (36.8 C)  TempSrc: Oral  Weight: 176 lb 8 oz (80.06 kg)   Body mass index is 26.84 kg/(m^2).  Physical Exam  Constitutional: He is oriented to person, place, and time.  He is smiling and in good spirits as usual.  Neck: Neck supple.  Cardiovascular: Normal rate and regular rhythm.   No murmur heard. Pulmonary/Chest: Breath sounds normal.  Abdominal: Soft. He exhibits no mass. There is no tenderness.  Neurological: He is alert and oriented to person, place, and time.  Skin: No rash noted.  Psychiatric: Mood and affect normal.  Lab Results CMP     Component Value Date/Time   NA 142 01/29/2015 1157   K 4.3 01/29/2015 1157   CL 106 01/29/2015 1157   CO2 23 01/29/2015 1157   GLUCOSE 77 01/29/2015 1157   BUN 11 01/29/2015 1157   CREATININE 1.11 01/29/2015 1157   CREATININE 1.28 05/10/2014 0335   CALCIUM 9.4 01/29/2015 1157   PROT 6.6 01/29/2015 1157   ALBUMIN 4.3 01/29/2015 1157   AST 25 01/29/2015 1157   ALT 18 01/29/2015 1157   ALKPHOS 59 01/29/2015 1157   BILITOT 0.6 01/29/2015 1157   GFRNONAA >89 12/07/2014 1002   GFRNONAA 71* 05/10/2014 0335   GFRAA >89 12/07/2014 1002   GFRAA 82* 05/10/2014 0335      Problem List Items Addressed This Visit      High    Tuberculosis, vertebral, diagnosis by culture    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 There was an interruption in his therapy late last year when he developed hepatitis and a rash secondary to appears in a modified, however, he is now back on 3 drug therapy with ethambutol, moxifloxacin and rifampin and doing well. I will check lab work today. Assuming his liver enzymes remain normal, I will repeat labs in one month and have him return in 2 months at which time I will consider stopping his TB therapy.      Relevant Orders   Comprehensive metabolic panel   Comprehensive metabolic panel       [name], MD [facility] for [place] [phone] pager   [phone] cell 02/19/2015, 10:33 AM

## 2015-02-19 NOTE — Assessment & Plan Note (Signed)
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 There was an interruption in his therapy late last year when he developed hepatitis and a rash secondary to appears in a modified, however, he is now back on 3 drug therapy with ethambutol, moxifloxacin and rifampin and doing well. I will check lab work today. Assuming his liver enzymes remain normal, I will repeat labs in one month and have him return in 2 months at which time I will consider stopping his TB therapy.

## 2015-02-19 NOTE — Telephone Encounter (Signed)
Douglas Curtis,  After counting Douglas Curtis doses, he needs 19 more biweekly doses to complete 39 weeks of therapy.  That would be March 6th , which is right on target.  [place] [facility] TB nurse  -----Original Message----- From: [name] [mailto:[email or web address]] Sent: Tuesday, February 19, 2015 10:37 AM To: Jesse Sans. Patience Musca Cc: Therisa Doyne; Imagene Sheller; Derl Barrow Pabon Subject: RE: Our skeletal TB patient  Mickel Baas,  Patient was in to see me today. He is doing well without any evidence of recurrence of his hepatitis or rash. He has now been back on ethambutol, moxifloxacin and rifampin for a little over one month and has completed about 7 months of total therapy. His liver enzymes have remained negative since restarting his 3 drug regimen. He will get repeat lab work today and again in 1 month. He will return to see me in 2 months to consider stopping TB therapy at that time. Thanks for your help with this difficult case.  Jenny Reichmann

## 2015-02-21 ENCOUNTER — Encounter: Payer: Self-pay | Admitting: Internal Medicine

## 2015-03-07 ENCOUNTER — Telehealth: Payer: Self-pay | Admitting: *Deleted

## 2015-03-07 NOTE — Telephone Encounter (Signed)
Deniece Portela, RN from [facility] calling for any labs or office notes between 10/21 - 1/2 to be faxed to [phone]. Done. Landis Gandy, RN

## 2015-04-25 ENCOUNTER — Telehealth: Payer: Self-pay | Admitting: *Deleted

## 2015-04-25 NOTE — Telephone Encounter (Signed)
At his last office visit with me on 02/19/2015 I suggested a follow-up visit in 2 months. I see that no appointments have been scheduled. Please call Douglas Curtis and see if he can schedule a follow-up visit soon and notify the health department. Thanks

## 2015-04-25 NOTE — Telephone Encounter (Signed)
[facility] asking about an "end-of-treatment" appt for patient.  RN spoke with the pt.  Patient stopped TB rxes on 04/23/15 as instructed by Dr. Megan Salon.  Pt will be in [facility] tomorrow afternoon after 3 PM.  Leaving early Monday morning.  He stated that he will not be returning until June, 2017.  MD please advise about need for appointment for "end-of-treatment."

## 2015-05-01 NOTE — Telephone Encounter (Addendum)
Requested pt call back to make appt for June 2017.  Left message for Imagene Sheller, RN [facility] that pt will make appt for June 2017 for "end-of-treatment" visit.

## 2015-05-10 ENCOUNTER — Ambulatory Visit (INDEPENDENT_AMBULATORY_CARE_PROVIDER_SITE_OTHER): Payer: No Typology Code available for payment source | Admitting: Internal Medicine

## 2015-05-10 ENCOUNTER — Encounter: Payer: Self-pay | Admitting: Internal Medicine

## 2015-05-10 DIAGNOSIS — A1801 Tuberculosis of spine: Secondary | ICD-10-CM | POA: Diagnosis not present

## 2015-05-10 NOTE — Assessment & Plan Note (Signed)
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 He will continue off of his TB medications. I will recheck a CBC and complete metabolic panel today. As long as there are no unusual abnormalities he can follow-up here as needed.

## 2015-05-10 NOTE — Progress Notes (Signed)
[facility] for Infectious Disease  Patient Active Problem List   Diagnosis Date Noted  . 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 Compression fracture of cervical spine ([place]) 05/09/2014    Priority: High  . Rash 11/30/2014    Priority: Medium  . Nausea without vomiting 11/30/2014    Priority: Medium  . Essential hypertension   . Heart murmur 05/09/2014  . Transaminitis   . Polycythemia     Patient's Medications  New Prescriptions   No medications on file  Previous Medications   No medications on file  Modified Medications   No medications on file  Discontinued Medications   ETHAMBUTOL (MYAMBUTOL) 400 MG TABLET    Take 10 tablets (4,000 mg total) by mouth daily. Take 10 tablets by mouth every Tuesday and Friday   MOXIFLOXACIN (AVELOX) 400 MG TABLET    Take 1 tablet (400 mg total) by mouth daily at 8 pm. Take 1 tablet by mouth every Tuesday and Friday   ONDANSETRON (ZOFRAN-ODT) 4 MG DISINTEGRATING TABLET    Take 1 as needed for nausea   RIFAMPIN (RIFADIN) 300 MG CAPSULE    Take 2 capsules (600 mg total) by mouth as directed. Take 2 capsules by mouth every Tuesday and Friday   TRIAMCINOLONE CREAM (KENALOG) 0.5 %    Apply 1 application topically 2 (two) times daily.    Subjective: Douglas Curtis is in for his routine follow-up visit. He is a 40 y.o. male who was in good health until November of 2015 when he woke up one morning with a "crick his neck". The pain did not go away so he was seen at the [facility] medical clinic where an x-ray was done. He was told that he had some arthritis of the spine and was treated with anti-inflammatory medication and muscle relaxers. He was seen there on several occasions. He underwent some physical therapy but continued to have progressive neck pain. He also noted muscle spasms from trying to hold his head and neck still. He began to have some pain in his upper arms and some cold sensation, numbness and tingling  in his fingers. His wife encouraged him to see another provider so he went to [facility] where another x-ray was done showing collapse of the C6 vertebrae. He was referred and underwent MRI which showed a near complete destruction of the C6 vertebral body with anterior and posterior extrusion of the C6 remnants.  He underwent emergent anterior cervical corpectomies of C5 and C6 with fusion one year ago. Initial impressions suggested pathologic destruction secondary to tumor. Microbiologic stains for AFB were negative. Gram stain was negative. 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  He had no known exposure to tuberculosis that he was aware of. He served in [employer] and traveled extensively in the [place]. He recalled having 2 negative TB skin test maybe 5-10 years ago. 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 He has not had repeat TB testing and at least 5 years. He has no history of pneumonia.   He was discharged on empiric 4 drug therapy for TB with isoniazid, rifampin, pyrazinamide and ethambutol. He returned to see me in June at which time he was doing better but final culture results were not available. Shortly after that visit his culture revealed isoniazid resistant  MTB. He was changed to a regimen of rifampin, pyrazinamide, ethambutol and moxifloxacin. He tolerated that well until he began to develop rash and right upper quadrant pain in late September. When he was seen he had developed hepatitis and all 4 drugs were stopped. Over the next few weeks his rash, pain and hepatitis resolved and he was sequentially restarted on ethambutol, moxifloxacin and rifampin. He has done well since that time and has now completed a total of 9 months of therapy. He stopped taking his medication on 04/23/2015. He is feeling well without complaint.    Review of Systems: Review of Systems  Constitutional: Negative for fever, chills, weight loss, malaise/fatigue and diaphoresis.  HENT: Negative for sore throat.   Respiratory: Negative for cough, sputum production and shortness of breath.   Cardiovascular: Negative for chest pain.  Gastrointestinal: Negative for nausea, vomiting and diarrhea.  Genitourinary: Negative for dysuria and frequency.  Musculoskeletal: Negative for myalgias, joint pain and neck pain.  Skin: Negative for rash.  Neurological: Negative for dizziness, sensory change, focal weakness and headaches.  Psychiatric/Behavioral: Negative for depression.    Past Medical History  Diagnosis Date  . Hypertension     Social History  Substance Use Topics  . Smoking status: Never Smoker   . Smokeless tobacco: Never Used  . Alcohol Use: No    Family History  Problem Relation Age of Onset  . Diabetes Mother   . Hypertension Father   . COPD Sister     No Known Allergies  Objective: Filed Vitals:   05/10/15 1546  BP: 145/96  Pulse: 93  Temp: 97.5 F (36.4 C)  TempSrc: Oral  Weight: 178 lb (80.74 kg)   Body mass index is 27.07 kg/(m^2).  Physical Exam  Constitutional: He is oriented to person, place, and time.  He is smiling and in good spirits as usual.  HENT:  Mouth/Throat: No oropharyngeal exudate.  Eyes: Conjunctivae are normal.  Cardiovascular: Normal rate and regular rhythm.   No murmur heard. Pulmonary/Chest: Breath sounds normal.  Abdominal: Soft. He exhibits no mass. There is no tenderness.  Musculoskeletal: Normal range of motion.  Healed neck incision with good range of motion without pain.  Neurological: He is alert and oriented to person, place, and time.  Skin: No rash noted.  Psychiatric: Mood and affect normal.    Lab Results    Problem List Items Addressed This Visit      High   Tuberculosis, vertebral, diagnosis by culture    I strongly suspect that his cervical  tuberculosis has been cured. He will continue off of his TB medications. I will recheck a CBC and complete metabolic panel today. As long as there are no unusual abnormalities he can follow-up here as needed.      Relevant Orders   CBC   Comprehensive metabolic panel     Lab Results  Component Value Date   WBC 9.1 05/13/2015   HGB 16.5 05/13/2015   HCT 47.9 05/13/2015   MCV 88.5 05/13/2015   PLT 278 05/13/2015   CMP     Component Value Date/Time   NA 142 05/13/2015 0924   K 4.3 05/13/2015 0924   CL 104 05/13/2015 0924   CO2 25 05/13/2015 0924   GLUCOSE 87 05/13/2015 0924   BUN 11 05/13/2015 0924   CREATININE 1.15 05/13/2015 0924   CREATININE 1.28 05/10/2014 0335   CALCIUM 9.6 05/13/2015 0924   PROT 7.0 05/13/2015 0924   ALBUMIN 4.6 05/13/2015 [id number]  AST 34 05/13/2015 0924   ALT 44 05/13/2015 0924   ALKPHOS 61 05/13/2015 0924   BILITOT 0.8 05/13/2015 0924   GFRNONAA >89 12/07/2014 1002   GFRNONAA 71* 05/10/2014 0335   GFRAA >89 12/07/2014 1002   GFRAA 82* 05/10/2014 [place] [place] [facility] [place] [facility] [phone] pager   [phone] cell 05/10/2015, 4:19 PM

## 2015-05-13 ENCOUNTER — Other Ambulatory Visit: Payer: No Typology Code available for payment source

## 2015-05-13 DIAGNOSIS — A1801 Tuberculosis of spine: Secondary | ICD-10-CM

## 2015-05-13 LAB — CBC
HCT: 47.9 % (ref 39.0–52.0)
HEMOGLOBIN: 16.5 g/dL (ref 13.0–17.0)
MCH: 30.5 pg (ref 26.0–34.0)
MCHC: 34.4 g/dL (ref 30.0–36.0)
MCV: 88.5 fL (ref 78.0–100.0)
MPV: 9.7 fL (ref 8.6–12.4)
PLATELETS: 278 10*3/uL (ref 150–400)
RBC: 5.41 MIL/uL (ref 4.22–5.81)
RDW: 13.2 % (ref 11.5–15.5)
WBC: 9.1 10*3/uL (ref 4.0–10.5)

## 2015-05-13 LAB — COMPREHENSIVE METABOLIC PANEL
ALBUMIN: 4.6 g/dL (ref 3.6–5.1)
ALT: 44 U/L (ref 9–46)
AST: 34 U/L (ref 10–40)
Alkaline Phosphatase: 61 U/L (ref 40–115)
BILIRUBIN TOTAL: 0.8 mg/dL (ref 0.2–1.2)
BUN: 11 mg/dL (ref 7–25)
CHLORIDE: 104 mmol/L (ref 98–110)
CO2: 25 mmol/L (ref 20–31)
CREATININE: 1.15 mg/dL (ref 0.60–1.35)
Calcium: 9.6 mg/dL (ref 8.6–10.3)
GLUCOSE: 87 mg/dL (ref 65–99)
Potassium: 4.3 mmol/L (ref 3.5–5.3)
SODIUM: 142 mmol/L (ref 135–146)
Total Protein: 7 g/dL (ref 6.1–8.1)

## 2016-02-07 IMAGING — CR DG CERVICAL SPINE COMPLETE 4+V
7 series · 7 of 7 positions shown · non-contrast
Comparison: None.

CLINICAL DATA: Neck pain radiating between the shoulder blades,
some tingling in the hands

EXAM:
CERVICAL SPINE  4+ VIEWS

[lateral]
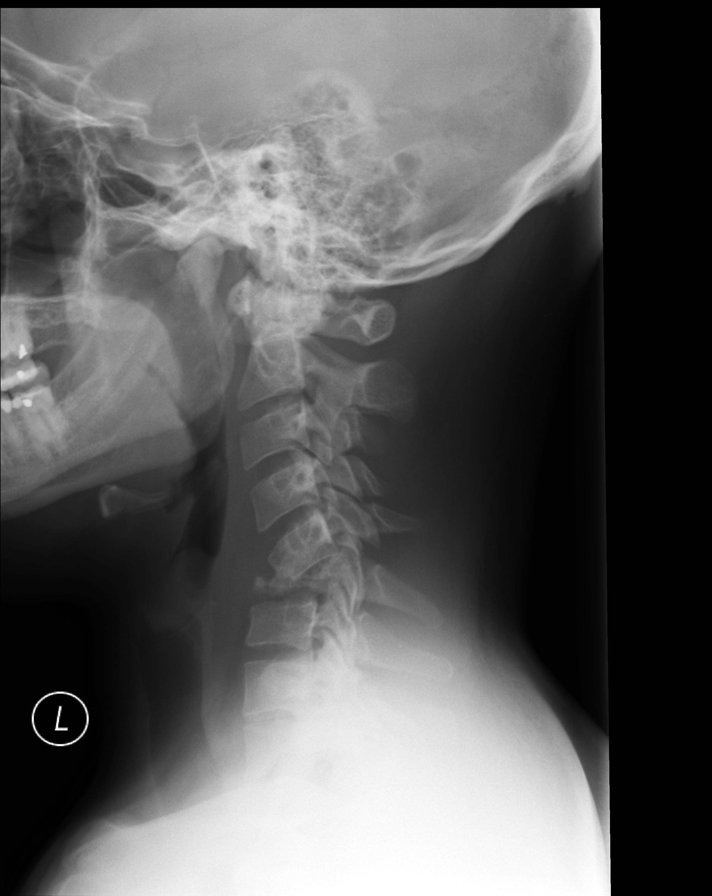

[ap open mouth]
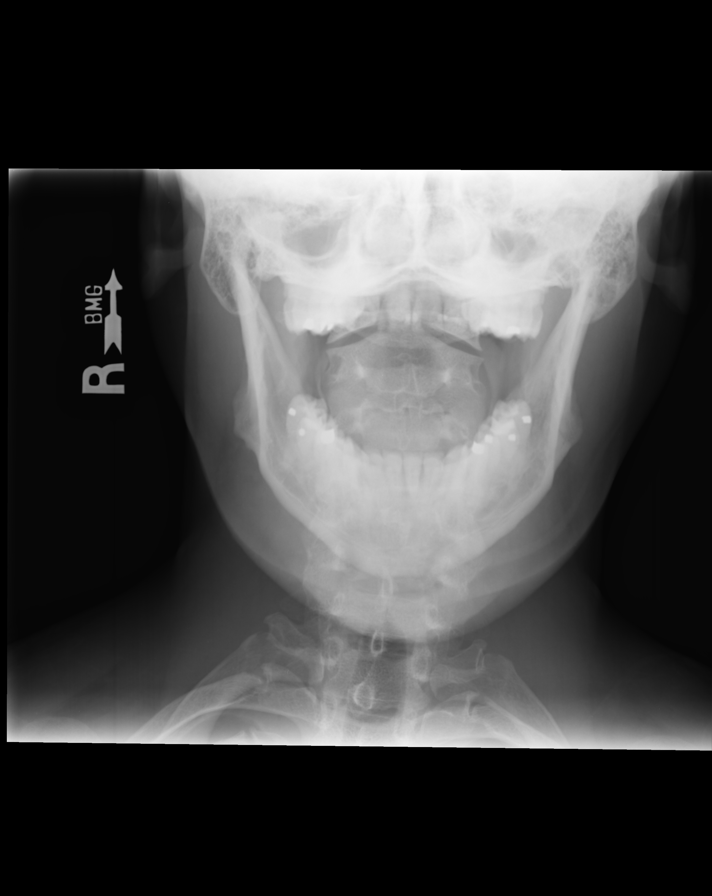

[AP (1 of 2)]
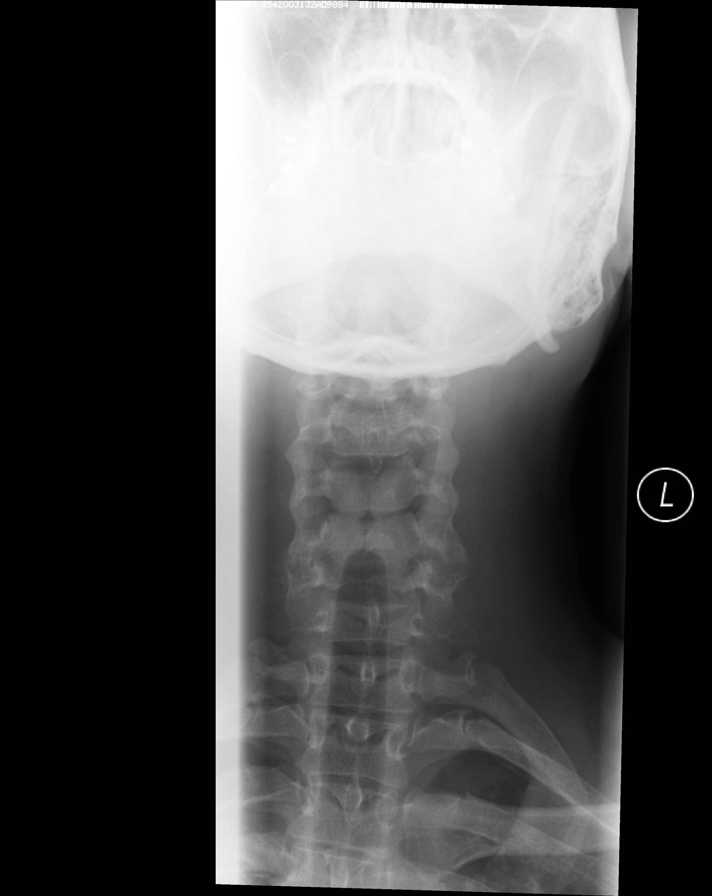

[lpo]
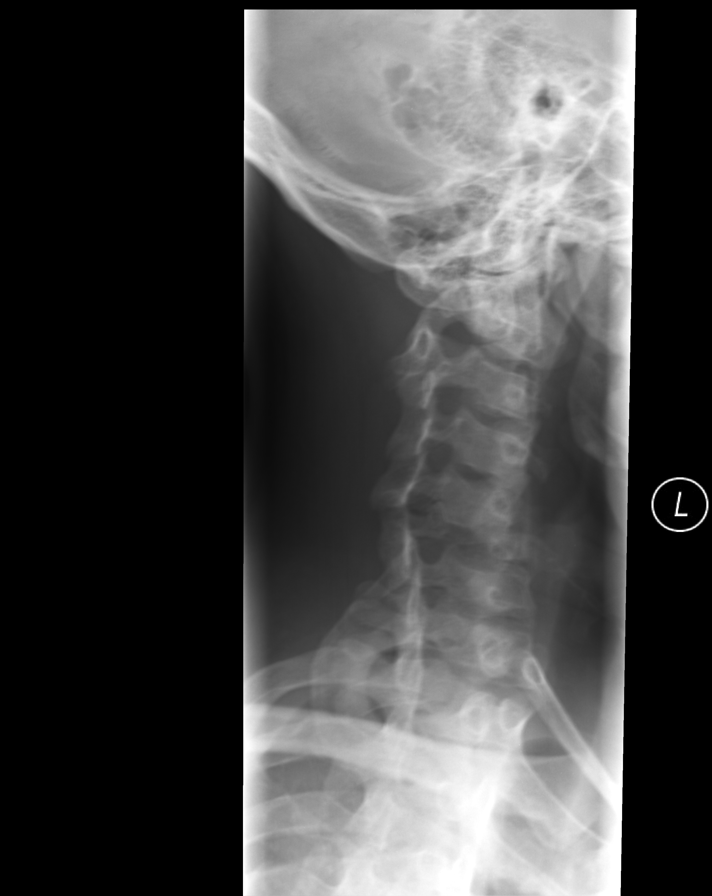

[rpo]
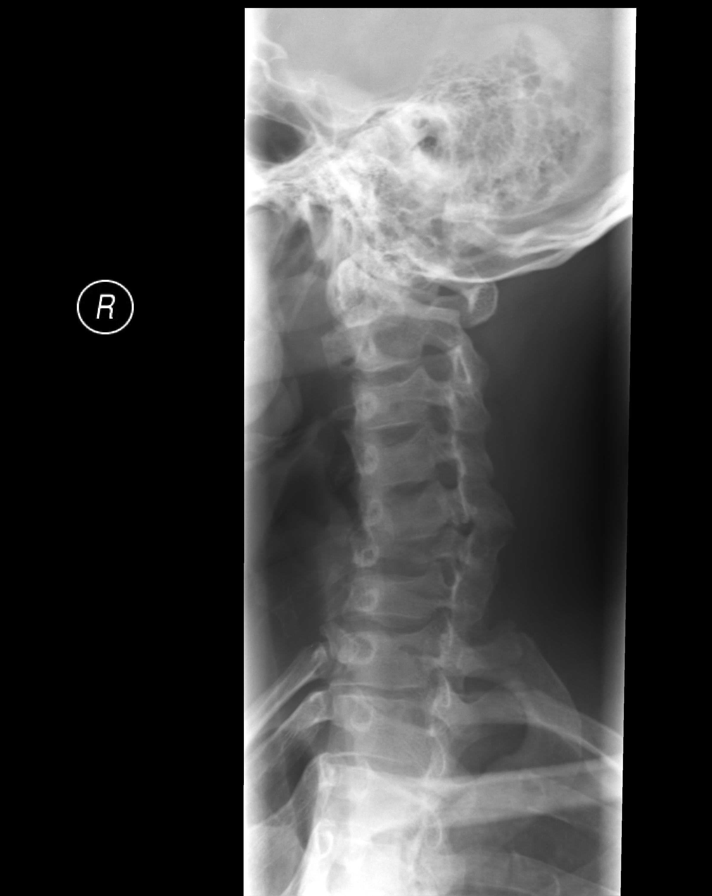

[swimmers]
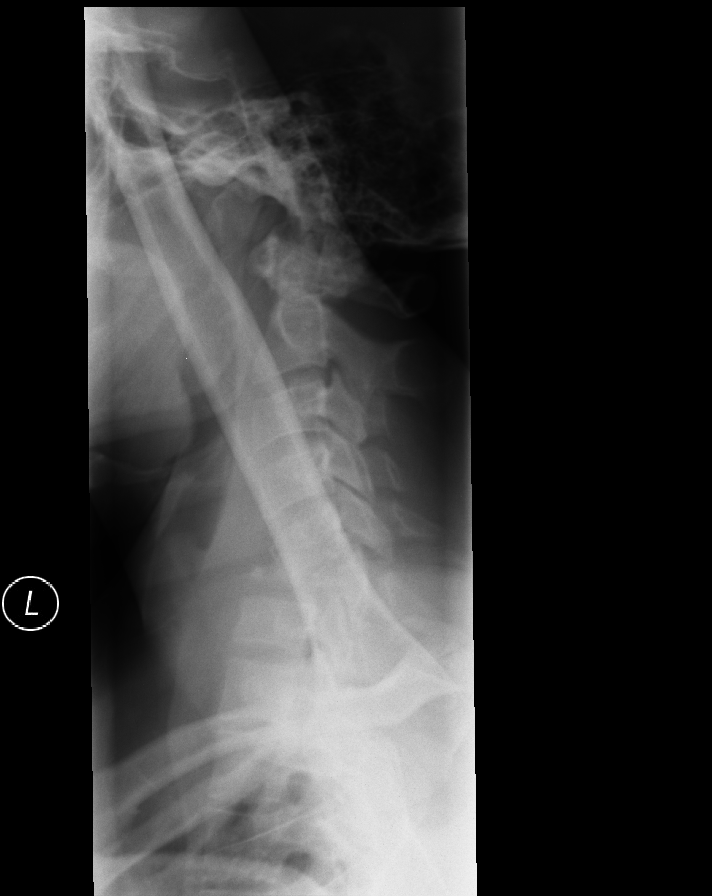

[AP (2 of 2)]
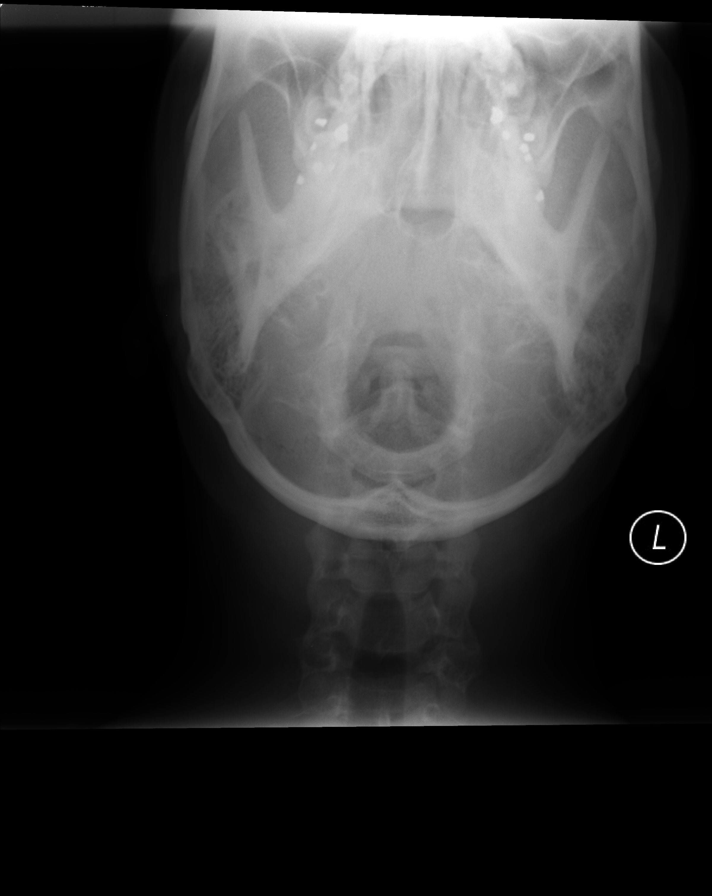

[7 of 7 positions shown; findings below may reference images not displayed]

FINDINGS: There is complete compression of C6 vertebral body representing
vertebra plana. The C5 body is tilted posteriorly and most likely
impinges upon the spinal canal and possibly the cord itself. There
is no history of trauma and no prevertebral soft tissue swelling is
noted.

I discussed these findings with Dr. Omale Tijani at the time of
interpretation. A neck collar has been placed on the patient and EMS
will transport the patient to the emergency department. I also
suggested that MR of the cervical spine without and with contrast be
performed to assess the cord further. Etiologies to consider would
be that of the eosinophilic granuloma, multiple myeloma, or possibly
metastatic disease.
IMPRESSION: Complete compression of C6 vertebral body with posterior tilting of
C5 most likely impinging upon the spinal canal and cord. Dr. Sainea
was contacted and the patient will be taken to the emergent
department and MRI of the cervical spine performed to assess
further.

Critical Value/emergent results were called by telephone at the time
of interpretation on 05/09/2014 at [DATE] to Dr. MELYNDA BILLIOT , who
verbally acknowledged these results.
